# Patient Record
Sex: Female | Born: 1995 | Race: Black or African American | Hispanic: No | Marital: Single | Smoking: Never smoker
Health system: Southern US, Community
[De-identification: ages and names within clinical notes are randomized; demographics above are authoritative.]

## PROBLEM LIST (undated history)

## (undated) DIAGNOSIS — E119 Type 2 diabetes mellitus without complications: Secondary | ICD-10-CM

## (undated) DIAGNOSIS — N949 Unspecified condition associated with female genital organs and menstrual cycle: Principal | ICD-10-CM

## (undated) DIAGNOSIS — Z7689 Persons encountering health services in other specified circumstances: Secondary | ICD-10-CM

## (undated) DIAGNOSIS — N946 Dysmenorrhea, unspecified: Secondary | ICD-10-CM

## (undated) DIAGNOSIS — N920 Excessive and frequent menstruation with regular cycle: Principal | ICD-10-CM

## (undated) DIAGNOSIS — F32A Depression, unspecified: Secondary | ICD-10-CM

## (undated) DIAGNOSIS — F411 Generalized anxiety disorder: Secondary | ICD-10-CM

## (undated) DIAGNOSIS — E669 Obesity, unspecified: Secondary | ICD-10-CM

## (undated) DIAGNOSIS — E282 Polycystic ovarian syndrome: Secondary | ICD-10-CM

## (undated) HISTORY — DX: Obesity, unspecified: E66.9

## (undated) HISTORY — DX: Polycystic ovarian syndrome: E28.2

## (undated) HISTORY — DX: Depression, unspecified: F32.A

## (undated) HISTORY — DX: Type 2 diabetes mellitus without complications: E11.9

## (undated) HISTORY — DX: Generalized anxiety disorder: F41.1

## (undated) HISTORY — DX: Dysmenorrhea, unspecified: N94.6

## (undated) HISTORY — DX: Unspecified condition associated with female genital organs and menstrual cycle: N94.9

## (undated) HISTORY — DX: Excessive and frequent menstruation with regular cycle: N92.0

## (undated) HISTORY — DX: Persons encountering health services in other specified circumstances: Z76.89

## (undated) HISTORY — PX: EXTERNAL EAR SURGERY: SHX627

---
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
## 2016-02-03 DIAGNOSIS — F339 Major depressive disorder, recurrent, unspecified: Secondary | ICD-10-CM | POA: Insufficient documentation

## 2016-02-03 DIAGNOSIS — F411 Generalized anxiety disorder: Secondary | ICD-10-CM | POA: Insufficient documentation

## 2019-06-16 DIAGNOSIS — O99345 Other mental disorders complicating the puerperium: Secondary | ICD-10-CM | POA: Insufficient documentation

## 2019-06-16 DIAGNOSIS — F53 Postpartum depression: Secondary | ICD-10-CM | POA: Insufficient documentation

## 2020-02-07 NOTE — L&D Delivery Note (Addendum)
OB/[facility]N Faculty Practice Delivery Note  Susan Kaiser is a 25 y.o. G2P1001 s/p NSVD at [redacted]w[redacted]d. She was admitted for IOL for chronic HTN and T2DM.   ROM: 4h 34m with clear fluid GBS Status: Negative Maximum Maternal Temperature: 99.5 degrees Fahreinheit  Labor Progress: Patient presented at IOL for cHTN and T2DM. She received cytotec x1, then pitocin and AROM performed, IUPC placed for contraction monitoring, and she progressed to complete/+2.  Delivery Date/Time: 01/24/21 at 1151 Delivery: Called to room and patient was complete and head delivering spontaneously. Head delivered LOA. Loose nuchal cord present, delivered through. Shoulder and body delivered in usual fashion. Infant with spontaneous cry, placed on mother's abdomen, dried and stimulated. Cord clamped x 2 after 1-minute delay, and cut by father of baby under my direct supervision. Due to sluggish breathing infant taken to warmer for stimulation and O2 monitoring. Cord blood drawn. Cord gases attempted but unable to collect. Placenta delivered spontaneously with gentle cord traction. Fundus firm with massage and Pitocin, but then had gush of bleeding with boggy tone appreciated and vaginal sweep with multiple clots, thus 800 mcg Cytotec given per rectum and with fundal massage tone improved and excellent hemostasis achieved. Labia, perineum, vagina, and cervix were inspected, no tears appreciated.   Placenta: complete, three vessel cord appreciated Complications: none Lacerations: None EBL: 250 mL Analgesia: epidural  Postpartum Planning [x]  message to sent to schedule follow-up    Infant: viable female infant   APGARs 7, 9 at 1 and 5 minutes respectively   weight pending  , MD [facility], [facility]       Attestation of Attending Supervision of Obstetric Fellow: Evaluation and management procedures were performed by the Obstetric Fellow under my supervision and collaboration.   I have reviewed the Obstetric Fellow's note and chart, and I agree with the management and plan. I have also made any necessary editorial changes.   [facility], MD Family Medicine Attending, [facility], [facility] 01/24/2021 3:16 PM

## 2020-06-30 ENCOUNTER — Encounter: Payer: Self-pay | Admitting: General Practice

## 2020-07-09 ENCOUNTER — Other Ambulatory Visit: Payer: Self-pay

## 2020-07-09 ENCOUNTER — Other Ambulatory Visit (HOSPITAL_COMMUNITY)
Admission: RE | Admit: 2020-07-09 | Discharge: 2020-07-09 | Disposition: A | Discharge status: Home / Self Care | Payer: Managed Care, Other (non HMO) | Source: Ambulatory Visit | Attending: Family Medicine | Admitting: Family Medicine

## 2020-07-09 ENCOUNTER — Other Ambulatory Visit (HOSPITAL_BASED_OUTPATIENT_CLINIC_OR_DEPARTMENT_OTHER): Payer: Self-pay

## 2020-07-09 ENCOUNTER — Ambulatory Visit (INDEPENDENT_AMBULATORY_CARE_PROVIDER_SITE_OTHER): Payer: Managed Care, Other (non HMO)

## 2020-07-09 VITALS — BP 128/72 | HR 86 | Wt >= 6400 oz

## 2020-07-09 DIAGNOSIS — O219 Vomiting of pregnancy, unspecified: Secondary | ICD-10-CM

## 2020-07-09 DIAGNOSIS — O0990 Supervision of high risk pregnancy, unspecified, unspecified trimester: Secondary | ICD-10-CM | POA: Insufficient documentation

## 2020-07-09 DIAGNOSIS — O10919 Unspecified pre-existing hypertension complicating pregnancy, unspecified trimester: Secondary | ICD-10-CM

## 2020-07-09 DIAGNOSIS — Z6841 Body Mass Index (BMI) 40.0 and over, adult: Secondary | ICD-10-CM

## 2020-07-09 DIAGNOSIS — F419 Anxiety disorder, unspecified: Secondary | ICD-10-CM

## 2020-07-09 DIAGNOSIS — F32A Depression, unspecified: Secondary | ICD-10-CM

## 2020-07-09 DIAGNOSIS — O24111 Pre-existing diabetes mellitus, type 2, in pregnancy, first trimester: Secondary | ICD-10-CM

## 2020-07-09 MED ORDER — SERTRALINE HCL 50 MG PO TABS
50.0000 mg | ORAL_TABLET | Freq: Every day | ORAL | 1 refills | Status: DC
  Filled 2020-07-09: qty 60, 60d supply, fill #0

## 2020-07-09 MED ORDER — DOXYLAMINE-PYRIDOXINE 10-10 MG PO TBEC
DELAYED_RELEASE_TABLET | ORAL | 0 refills | Status: DC
  Filled 2020-07-09: qty 120, 30d supply, fill #0

## 2020-07-09 NOTE — Patient Instructions (Signed)
Obstetrics: Normal and Problem Pregnancies (7th ed., pp. 102-121). [place], [place]: Elsevier."> Textbook of Family Medicine (9th ed., pp. 365-410). [place], [place]: Elsevier [employer].">  First Trimester of Pregnancy  The first trimester of pregnancy starts on the first day of your last menstrual period until the end of week 12. This is months 1 through 3 of pregnancy. A week after a sperm fertilizes an egg, the egg will implant into the wall of the uterus and begin to develop into a baby. By the end of 12 weeks, all the baby's organs will be formed and the baby will be 2-3 inches in size. Body changes during your first trimester Your body goes through many changes during pregnancy. The changes vary and generally return to normal after your baby is born. Physical changes  You may gain or lose weight.  Your breasts may begin to grow larger and become tender. The tissue that surrounds your nipples (areola) may become darker.  Dark spots or blotches (chloasma or mask of pregnancy) may develop on your face.  You may have changes in your hair. These can include thickening or thinning of your hair or changes in texture. Health changes  You may feel nauseous, and you may vomit.  You may have heartburn.  You may develop headaches.  You may develop constipation.  Your gums may bleed and may be sensitive to brushing and flossing. Other changes  You may tire easily.  You may urinate more often.  Your menstrual periods will stop.  You may have a loss of appetite.  You may develop cravings for certain kinds of food.  You may have changes in your emotions from day to day.  You may have more vivid and strange dreams. Follow these instructions at home: Medicines  Follow your health care provider's instructions regarding medicine use. Specific medicines may be either safe or unsafe to take during pregnancy. Do not take any medicines unless told to by your health care provider.  Take a  prenatal vitamin that contains at least 600 micrograms (mcg) of folic acid. Eating and drinking  Eat a healthy diet that includes fresh fruits and vegetables, whole grains, good sources of protein such as meat, eggs, or tofu, and low-fat dairy products.  Avoid raw meat and unpasteurized juice, milk, and cheese. These carry germs that can harm you and your baby.  If you feel nauseous or you vomit: ? Eat 4 or 5 small meals a day instead of 3 large meals. ? Try eating a few soda crackers. ? Drink liquids between meals instead of during meals.  You may need to take these actions to prevent or treat constipation: ? Drink enough fluid to keep your urine pale yellow. ? Eat foods that are high in fiber, such as beans, whole grains, and fresh fruits and vegetables. ? Limit foods that are high in fat and processed sugars, such as fried or sweet foods. Activity  Exercise only as directed by your health care provider. Most people can continue their usual exercise routine during pregnancy. Try to exercise for 30 minutes at least 5 days a week.  Stop exercising if you develop pain or cramping in the lower abdomen or lower back.  Avoid exercising if it is very hot or humid or if you are at high altitude.  Avoid heavy lifting.  If you choose to, you may have sex unless your health care provider tells you not to. Relieving pain and discomfort  Wear a good support bra to relieve breast   tenderness.  Rest with your legs elevated if you have leg cramps or low back pain.  If you develop bulging veins (varicose veins) in your legs: ? Wear support hose as told by your health care provider. ? Elevate your feet for 15 minutes, 3-4 times a day. ? Limit salt in your diet. Safety  Wear your seat belt at all times when driving or riding in a car.  Talk with your health care provider if someone is verbally or physically abusive to you.  Talk with your health care provider if you are feeling sad or have  thoughts of hurting yourself. Lifestyle  Do not use hot tubs, steam rooms, or saunas.  Do not douche. Do not use tampons or scented sanitary pads.  Do not use herbal remedies, alcohol, illegal drugs, or medicines that are not approved by your health care provider. Chemicals in these products can harm your baby.  Do not use any products that contain nicotine or tobacco, such as cigarettes, e-cigarettes, and chewing tobacco. If you need help quitting, ask your health care provider.  Avoid cat litter boxes and soil used by cats. These carry germs that can cause birth defects in the baby and possibly loss of the unborn baby (fetus) by miscarriage or stillbirth. General instructions  During routine prenatal visits in the first trimester, your health care provider will do a physical exam, perform necessary tests, and ask you how things are going. Keep all follow-up visits. This is important.  Ask for help if you have counseling or nutritional needs during pregnancy. Your health care provider can offer advice or refer you to specialists for help with various needs.  Schedule a dentist appointment. At home, brush your teeth with a soft toothbrush. Floss gently.  Write down your questions. Take them to your prenatal visits. Where to find more information  American Pregnancy Association: americanpregnancy.org  American College of Obstetricians and Gynecologists: acog.org/en/Womens%20Health/Pregnancy  Office on Women's Health: womenshealth.gov/pregnancy Contact a health care provider if you have:  Dizziness.  A fever.  Mild pelvic cramps, pelvic pressure, or nagging pain in the abdominal area.  Nausea, vomiting, or diarrhea that lasts for 24 hours or longer.  A bad-smelling vaginal discharge.  Pain when you urinate.  Known exposure to a contagious illness, such as chickenpox, measles, Zika virus, HIV, or hepatitis. Get help right away if you have:  Spotting or bleeding from your  vagina.  Severe abdominal cramping or pain.  Shortness of breath or chest pain.  Any kind of trauma, such as from a fall or a car crash.  New or increased pain, swelling, or redness in an arm or leg. Summary  The first trimester of pregnancy starts on the first day of your last menstrual period until the end of week 12 (months 1 through 3).  Eating 4 or 5 small meals a day rather than 3 large meals may help to relieve nausea and vomiting.  Do not use any products that contain nicotine or tobacco, such as cigarettes, e-cigarettes, and chewing tobacco. If you need help quitting, ask your health care provider.  Keep all follow-up visits. This is important. This information is not intended to replace advice given to you by your health care provider. Make sure you discuss any questions you have with your health care provider. Document Revised: 07/02/2019 Document Reviewed: 05/08/2019 Elsevier Patient Education  2021 Elsevier Inc.  

## 2020-07-09 NOTE — Progress Notes (Signed)
Subjective:   Susan Kaiser is a 25 y.o. G1P0 at [redacted]w[redacted]d by LMP being seen today for her first obstetrical visit.  This is an unplanned, but welcomed pregnancy. Her obstetrical history is significant for cHTN, T2DM, PPD and has Menorrhagia; Obesity; Menstrual extraction; PCO (polycystic ovaries); Endometrial polyp; Unspecified symptom associated with female genital organs; Contraceptive management; and Dysmenorrhea on their problem list.. Patient does intend to breast feed. Pregnancy history fully reviewed.  Patient reports nausea, vomiting and decreased appetite. Has not tried anything to help with nausea. Usually sleeps to help cope. She also reports external vaginal itching x1-2 days. No discharge, bleeding, or abdominal pain. Of note, her PHQ on today's visit was 20. 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 She feels as if her mood has been up and down, but she "just deals with it". She denies SI/HI. She expresses interest in restarting Zoloft today.  HISTORY: OB History  Gravida Para Term Preterm AB Living  2 1 1  0 0 1  SAB IAB Ectopic Multiple Live Births  0 0 0 0 0    # Outcome Date GA Lbr Len/2nd Weight Sex Delivery Anes PTL Lv  2 Current           1 Term            Past Medical History:  Diagnosis Date  . Depression   . Diabetes mellitus without complication (HCC)   . Dysmenorrhea 09/17/2014  . Generalized anxiety disorder   . Menorrhagia 04/21/2013  . Menstrual extraction 05/26/2013  . Obesity   . PCO (polycystic ovaries) 06/10/2013  . Unspecified symptom associated with female genital organs 06/10/2013   Past Surgical History:  Procedure Laterality Date  . EXTERNAL EAR SURGERY     stuck a corn kernnel in her ear when she was 4    Family History  Problem Relation Age of Onset  . Diabetes Paternal Aunt   . Diabetes Paternal Uncle   . Diabetes Maternal Grandmother   . Diabetes Maternal  Grandfather   . Other Mother        cysts on ovaries  . Heart disease Father    Social History   Tobacco Use  . Smoking status: Never Smoker  . Smokeless tobacco: Never Used  Vaping Use  . Vaping Use: Never used  Substance Use Topics  . Alcohol use: No  . Drug use: No   Allergies  Allergen Reactions  . Amoxicillin Anaphylaxis, Itching and Rash   Current Outpatient Medications on File Prior to Visit  Medication Sig Dispense Refill  . prenatal vitamin w/FE, FA (NATACHEW) 29-1 MG CHEW chewable tablet Chew 1 tablet by mouth daily at 12 noon.     No current facility-administered medications on file prior to visit.   Indications for ASA therapy (per uptodate) One of the following: Previous pregnancy with preeclampsia, especially early onset and with an adverse outcome No Multifetal gestation No Chronic hypertension Yes Type 1 or 2 diabetes mellitus Yes Chronic kidney disease No Autoimmune disease (antiphospholipid syndrome, systemic lupus erythematosus) No  Two or more of the following: Nulliparity No Obesity (body mass index >30 kg/m2) Yes Family history of preeclampsia in mother or sister No Age ?35 years No Sociodemographic characteristics (African American race, low socioeconomic level) Yes Personal risk factors (eg, previous pregnancy with low birth weight or small for gestational age infant, previous adverse pregnancy outcome [eg, stillbirth],  interval >10 years between pregnancies) No  Indications for early 1 hour GTT (per uptodate)  BMI >25 (>23 in Asian women) AND one of the following  Gestational diabetes mellitus in a previous pregnancy No Glycated hemoglobin ?5.7 percent (39 mmol/mol), impaired glucose tolerance, or impaired fasting glucose on previous testing No First-degree relative with diabetes No High-risk race/ethnicity (eg, African American, Latino, Native American, [place] American, Pacific Islander) Yes History of cardiovascular disease No Hypertension  or on therapy for hypertension Yes High-density lipoprotein cholesterol level <35 mg/dL (1.61 mmol/L) and/or a triglyceride level >250 mg/dL (0.96 mmol/L) No Polycystic ovary syndrome No Physical inactivity Yes Other clinical condition associated with insulin resistance (eg, severe obesity, acanthosis nigricans) Yes Previous birth of an infant weighing ?4000 g No Previous stillbirth of unknown cause No Exam   Vitals:   07/09/20 0902  BP: 128/72  Pulse: 86  Weight: (!) 424 lb (192.3 kg)      Uterus:     Pelvic Exam: Perineum: no hemorrhoids, normal perineum   Vulva: normal external genitalia, no lesions   Vagina:  normal mucosa, normal discharge   Cervix: Smooth, pink, parous, no lesions, pap smear done.    Adnexa: Technically difficult d/t maternal body habitus   Bony Pelvis: average  System: General: well-developed, well-nourished female in no acute distress   Breast:  normal appearance, no masses or tenderness   Skin: normal coloration and turgor, no rashes   Neurologic: oriented, normal, negative, normal mood   Extremities: normal strength, tone, and muscle mass, ROM of all joints is normal   HEENT PERRLA, extraocular movement intact and sclera clear, anicteric   Mouth/Teeth mucous membranes moist, pharynx normal without lesions and dental hygiene good   Neck supple and no masses   Cardiovascular: regular rate and rhythm   Respiratory:  no respiratory distress, normal breath sounds   Abdomen: soft, non-tender; bowel sounds normal; no masses,  no organomegaly     Assessment:   Pregnancy: G1P0 Patient Active Problem List   Diagnosis Date Noted  . Contraceptive management 09/17/2014  . Dysmenorrhea 09/17/2014  . PCO (polycystic ovaries) 06/10/2013  . Endometrial polyp 06/10/2013  . Unspecified symptom associated with female genital organs 06/10/2013  . Menstrual extraction 05/26/2013  . Menorrhagia 04/21/2013  . Obesity 04/21/2013     Plan:   1. Supervision of  high risk pregnancy, antepartum  - Cytology - PAP( [place]) - Culture, OB Urine - CBC/D/Plt+RPR+Rh+ABO+RubIgG... - Hemoglobpathy+Fer w/A Thal Rfx - Hemoglobin A1c - Cervicovaginal ancillary only( [place]) - US OB Comp Less 14 Wks; Future - Ambulatory referral to Integrated Behavioral Health  2. Anxiety and depression - Given patient's mood and PHQ score today, will start patient on Zoloft 50mg . - Referral to Integrated behavioral health also placed - Warning signs reviewed with patient   - Ambulatory referral to Integrated Behavioral Health - sertraline (ZOLOFT) 50 MG tablet; Take 1 tablet (50 mg total) by mouth daily.  Dispense: 60 tablet; Refill: 1  3. Chronic hypertension affecting pregnancy - Normotensive today - Not on meds - Plan bASA starting at 12-16 weeks  - CMP - Protein creatinine ratio, urine  4. Type 2 diabetes mellitus affecting pregnancy in first trimester, antepartum - Diagnosed prior to previous pregnancy - Was previously on Metformin, however not currently taking medications   - Referral to Nutrition and Diabetes Services  5. Morbid obesity with BMI of 60.0-69.9, adult (HCC) - Plan growth q4 weeks starting at 24 weeks - Weekly BPP starting at  36 weeks  6. Nausea and vomiting during pregnancy  - Doxylamine-Pyridoxine 10-10 MG TBEC; Take 2 tabs at bedtime. If needed, add another tab in the morning. If needed, add another tab in the afternoon, up to 4 tabs/day.  Dispense: 120 tablet; Refill: 0   Initial labs drawn. Continue prenatal vitamins. Discussed and offered genetic screening options, including Quad screen/AFP, NIPS testing, and option to decline testing. Benefits/risks/alternatives reviewed. Pt aware that anatomy US is form of genetic screening with lower accuracy in detecting trisomies than blood work.  Pt declines genetic screening today. NIPS: undecided. Ultrasound discussed; fetal anatomic survey: requested. Problem list reviewed and  updated. The nature of [facility] with multiple MDs and other Advanced Practice Providers was explained to patient; also emphasized that residents, students are part of our team. Routine obstetric precautions reviewed. Return in about 4 weeks (around 08/06/2020) for ROB.    Brand Males, CNM 07/09/20  11:54 AM

## 2020-07-09 NOTE — Progress Notes (Signed)
Pt c/o yeast infection PHQ 20 GAD 18 Pt is unsure when last pap was

## 2020-07-10 LAB — COMPREHENSIVE METABOLIC PANEL
ALT: 26 IU/L (ref 0–32)
AST: 11 IU/L (ref 0–40)
Albumin/Globulin Ratio: 1.5 (ref 1.2–2.2)
Albumin: 4.1 g/dL (ref 3.9–5.0)
Alkaline Phosphatase: 50 IU/L (ref 44–121)
BUN/Creatinine Ratio: 7 — ABNORMAL LOW (ref 9–23)
BUN: 5 mg/dL — ABNORMAL LOW (ref 6–20)
Bilirubin Total: 0.2 mg/dL (ref 0.0–1.2)
CO2: 18 mmol/L — ABNORMAL LOW (ref 20–29)
Calcium: 8.9 mg/dL (ref 8.7–10.2)
Chloride: 106 mmol/L (ref 96–106)
Creatinine, Ser: 0.75 mg/dL (ref 0.57–1.00)
Globulin, Total: 2.8 g/dL (ref 1.5–4.5)
Glucose: 111 mg/dL — ABNORMAL HIGH (ref 65–99)
Potassium: 4 mmol/L (ref 3.5–5.2)
Sodium: 138 mmol/L (ref 134–144)
Total Protein: 6.9 g/dL (ref 6.0–8.5)
eGFR: 114 mL/min/{1.73_m2} (ref >59)

## 2020-07-12 ENCOUNTER — Other Ambulatory Visit (HOSPITAL_BASED_OUTPATIENT_CLINIC_OR_DEPARTMENT_OTHER): Payer: Self-pay

## 2020-07-12 LAB — CERVICOVAGINAL ANCILLARY ONLY
Bacterial Vaginitis (gardnerella): POSITIVE — AB
Candida Glabrata: NEGATIVE
Candida Vaginitis: POSITIVE — AB
Comment: NEGATIVE
Comment: NEGATIVE
Comment: NEGATIVE

## 2020-07-12 LAB — CBC/D/PLT+RPR+RH+ABO+RUBIGG...
Antibody Screen: NEGATIVE
Basophils Absolute: 0 10*3/uL (ref 0.0–0.2)
Basos: 0 %
EOS (ABSOLUTE): 0.3 10*3/uL (ref 0.0–0.4)
Eos: 5 %
HCV Ab: <0.1 s/co ratio (ref 0.0–0.9)
HIV Screen 4th Generation wRfx: NONREACTIVE
Hematocrit: 38.9 % (ref 34.0–46.6)
Hemoglobin: 12.7 g/dL (ref 11.1–15.9)
Hepatitis B Surface Ag: NEGATIVE
Immature Grans (Abs): 0 10*3/uL (ref 0.0–0.1)
Immature Granulocytes: 0 %
Lymphocytes Absolute: 1.6 10*3/uL (ref 0.7–3.1)
Lymphs: 24 %
MCH: 26.6 pg (ref 26.6–33.0)
MCHC: 32.6 g/dL (ref 31.5–35.7)
MCV: 81 fL (ref 79–97)
Monocytes Absolute: 0.3 10*3/uL (ref 0.1–0.9)
Monocytes: 4 %
Neutrophils Absolute: 4.6 10*3/uL (ref 1.4–7.0)
Neutrophils: 67 %
Platelets: 200 10*3/uL (ref 150–450)
RBC: 4.78 x10E6/uL (ref 3.77–5.28)
RDW: 15.8 % — ABNORMAL HIGH (ref 11.7–15.4)
RPR Ser Ql: NONREACTIVE
Rh Factor: POSITIVE
Rubella Antibodies, IGG: 11.1 index (ref >0.99)
WBC: 6.9 10*3/uL (ref 3.4–10.8)

## 2020-07-12 LAB — HGB SOLUBILITY: Hgb Solubility: POSITIVE — AB

## 2020-07-12 LAB — HEMOGLOBPATHY+FER W/A THAL RFX
Ferritin: 28 ng/mL (ref 15–150)
Hgb A2: 3.1 % (ref 1.8–3.2)
Hgb A: 58.5 % — ABNORMAL LOW (ref 96.4–98.8)
Hgb F: 0 % (ref 0.0–2.0)
Hgb S: 38.4 % — ABNORMAL HIGH

## 2020-07-12 LAB — CYTOLOGY - PAP
Chlamydia: NEGATIVE
Comment: NEGATIVE
Comment: NORMAL
Diagnosis: NEGATIVE
Neisseria Gonorrhea: NEGATIVE

## 2020-07-12 LAB — HEMOGLOBIN A1C
Est. average glucose Bld gHb Est-mCnc: 123 mg/dL
Hgb A1c MFr Bld: 5.9 % — ABNORMAL HIGH (ref 4.8–5.6)

## 2020-07-12 LAB — HCV INTERPRETATION

## 2020-07-14 LAB — CULTURE, OB URINE

## 2020-07-14 LAB — URINE CULTURE, OB REFLEX

## 2020-07-16 ENCOUNTER — Other Ambulatory Visit: Payer: Self-pay

## 2020-07-16 DIAGNOSIS — O98819 Other maternal infectious and parasitic diseases complicating pregnancy, unspecified trimester: Secondary | ICD-10-CM

## 2020-07-16 DIAGNOSIS — B9689 Other specified bacterial agents as the cause of diseases classified elsewhere: Secondary | ICD-10-CM

## 2020-07-16 MED ORDER — METRONIDAZOLE 500 MG PO TABS
500.0000 mg | ORAL_TABLET | Freq: Two times a day (BID) | ORAL | 0 refills | Status: DC
Start: 2020-07-16 — End: 2020-10-21

## 2020-07-16 MED ORDER — TERCONAZOLE 0.4 % VA CREA: 1.0000 | TOPICAL_CREAM | Freq: Every day | VAGINAL | 0 refills | Status: DC

## 2020-07-19 ENCOUNTER — Ambulatory Visit (HOSPITAL_BASED_OUTPATIENT_CLINIC_OR_DEPARTMENT_OTHER)
Admission: RE | Admit: 2020-07-19 | Discharge: 2020-07-19 | Disposition: A | Discharge status: Home / Self Care | Payer: Managed Care, Other (non HMO) | Source: Ambulatory Visit

## 2020-07-19 ENCOUNTER — Other Ambulatory Visit: Payer: Self-pay

## 2020-07-19 DIAGNOSIS — O0990 Supervision of high risk pregnancy, unspecified, unspecified trimester: Secondary | ICD-10-CM | POA: Insufficient documentation

## 2020-07-20 ENCOUNTER — Other Ambulatory Visit: Payer: Managed Care, Other (non HMO)

## 2020-07-20 ENCOUNTER — Telehealth: Payer: Self-pay | Admitting: Registered"

## 2020-07-20 NOTE — Telephone Encounter (Signed)
Patient was scheduled for pre-existing diabetes in pregnancy education. Patient did not show for appointment and when RD called patient to reschedule, did not reach patient and was not able to LVM.

## 2020-07-22 ENCOUNTER — Encounter: Payer: Managed Care, Other (non HMO) | Admitting: Licensed Clinical Social Worker

## 2020-07-22 ENCOUNTER — Telehealth: Payer: Self-pay | Admitting: Licensed Clinical Social Worker

## 2020-07-22 NOTE — Telephone Encounter (Signed)
Called pt regarding scheduled mychart visit  

## 2020-08-04 ENCOUNTER — Ambulatory Visit (INDEPENDENT_AMBULATORY_CARE_PROVIDER_SITE_OTHER): Payer: Managed Care, Other (non HMO) | Admitting: Obstetrics & Gynecology

## 2020-08-04 ENCOUNTER — Encounter: Payer: Self-pay | Admitting: Obstetrics & Gynecology

## 2020-08-04 ENCOUNTER — Other Ambulatory Visit: Payer: Self-pay

## 2020-08-04 VITALS — BP 143/64 | HR 93 | Ht 70.0 in | Wt >= 6400 oz

## 2020-08-04 DIAGNOSIS — O0990 Supervision of high risk pregnancy, unspecified, unspecified trimester: Secondary | ICD-10-CM | POA: Insufficient documentation

## 2020-08-04 DIAGNOSIS — O09899 Supervision of other high risk pregnancies, unspecified trimester: Secondary | ICD-10-CM | POA: Insufficient documentation

## 2020-08-04 DIAGNOSIS — Z6841 Body Mass Index (BMI) 40.0 and over, adult: Secondary | ICD-10-CM | POA: Insufficient documentation

## 2020-08-04 DIAGNOSIS — Z3480 Encounter for supervision of other normal pregnancy, unspecified trimester: Secondary | ICD-10-CM

## 2020-08-04 DIAGNOSIS — O24319 Unspecified pre-existing diabetes mellitus in pregnancy, unspecified trimester: Secondary | ICD-10-CM | POA: Insufficient documentation

## 2020-08-04 DIAGNOSIS — O99210 Obesity complicating pregnancy, unspecified trimester: Secondary | ICD-10-CM

## 2020-08-04 DIAGNOSIS — O10019 Pre-existing essential hypertension complicating pregnancy, unspecified trimester: Secondary | ICD-10-CM | POA: Insufficient documentation

## 2020-08-04 DIAGNOSIS — Z3A14 14 weeks gestation of pregnancy: Secondary | ICD-10-CM

## 2020-08-04 NOTE — Progress Notes (Signed)
   PRENATAL VISIT NOTE  Subjective:  Susan Kaiser is a 25 y.o. G2P1001 at [redacted]w[redacted]d being seen today for ongoing prenatal care.  She is currently monitored for the following issues for this high-risk pregnancy and has Obesity affecting pregnancy, antepartum; PCO (polycystic ovaries); Supervision of other normal pregnancy, antepartum; Chronic benign essential hypertension, antepartum; Pre-existing diabetes mellitus affecting pregnancy, antepartum; BMI 60.0-69.9, adult (HCC); and Maternal exposure to alcohol, antepartum on their problem list.  Patient reports nausea.  Contractions: Not present. Vag. Bleeding: None.  Movement: Absent. Denies leaking of fluid.   The following portions of the patient's history were reviewed and updated as appropriate: allergies, current medications, past family history, past medical history, past social history, past surgical history and problem list.   Objective:   Vitals:   08/04/20 1629  BP: (!) 143/64  Pulse: 93  Weight: (!) 424 lb (192.3 kg)  Height: 5\' 10"  (1.778 m)    Fetal Status:     Movement: Absent     General:  Alert, oriented and cooperative. Patient is in no acute distress.  Skin: Skin is warm and dry. No rash noted.   Cardiovascular: Normal heart rate noted  Respiratory: Normal respiratory effort, no problems with respiration noted  Abdomen: Soft, gravid, appropriate for gestational age.  Pain/Pressure: Absent     Pelvic: Cervical exam deferred        Extremities: Normal range of motion.  Edema: None  Mental Status: Normal mood and affect. Normal behavior. Normal judgment and thought content.   Assessment and Plan:  Pregnancy: G2P1001 at [redacted]w[redacted]d 1. Supervision of other normal pregnancy, antepartum FHR viewed using abd [redacted]w[redacted]d Will come back in am for NIPS  2. Chronic benign essential hypertension, antepartum Begin baby ASA  Pt was not on meds prior to pregnancy    - Referral to Nutrition and Diabetes Services - [place] MFM OB DETAIL +14 WK;  Future  3. Pre-existing diabetes mellitus affecting pregnancy, antepartum Begin baby ASA  Pt was not on meds prior to pregnancy  - Referral to Nutrition and Diabetes Services - [place] MFM OB DETAIL +14 WK; Future  4. [redacted] weeks gestation of pregnancy  5. Obesity affecting pregnancy, antepartum Nutrition counseling.  Pt is heavier than with first baby.  Gained weight PP due to depression   6. Early ETOH exposure.    Preterm labor symptoms and general obstetric precautions including but not limited to vaginal bleeding, contractions, leaking of fluid and fetal movement were reviewed in detail with the patient. Please refer to After Visit Summary for other counseling recommendations.   Return in about 4 weeks (around 09/01/2020) for in person.  Future Appointments  Date Time Provider Department Center  09/01/2020  3:15 PM 09/03/2020, MD [facility] None  09/30/2020  9:55 AM 10/02/2020, Adrian Blackwater, DO [facility] None    Rhona Raider, MD

## 2020-08-04 NOTE — Progress Notes (Signed)
Pt missed appointment with Nutrition management. Has not rescheduled.

## 2020-08-10 ENCOUNTER — Other Ambulatory Visit: Payer: Self-pay

## 2020-08-10 ENCOUNTER — Ambulatory Visit (INDEPENDENT_AMBULATORY_CARE_PROVIDER_SITE_OTHER): Payer: Managed Care, Other (non HMO)

## 2020-08-10 DIAGNOSIS — Z3A15 15 weeks gestation of pregnancy: Secondary | ICD-10-CM

## 2020-08-10 NOTE — Progress Notes (Signed)
Pt presents for Panorama and AFP labs. Pt was sent to the lab have labs drawn.  Dhilan Brauer l Jessaca Philippi, CMA

## 2020-08-14 LAB — AFP, SERUM, OPEN SPINA BIFIDA
AFP MoM: 0.5
AFP Value: 10.7 ng/mL
Gest. Age on Collection Date: 15.2 weeks
Maternal Age At EDD: 25.2 yr
OSBR Risk 1 IN: 10000
Test Results:: NEGATIVE
Weight: 424 [lb_av]

## 2020-08-19 ENCOUNTER — Other Ambulatory Visit: Payer: Managed Care, Other (non HMO)

## 2020-08-23 ENCOUNTER — Encounter: Payer: Self-pay | Admitting: General Practice

## 2020-09-01 ENCOUNTER — Encounter: Payer: Managed Care, Other (non HMO) | Admitting: Obstetrics & Gynecology

## 2020-09-06 ENCOUNTER — Ambulatory Visit: Payer: Managed Care, Other (non HMO)

## 2020-09-07 ENCOUNTER — Other Ambulatory Visit: Payer: Self-pay

## 2020-09-07 ENCOUNTER — Ambulatory Visit (INDEPENDENT_AMBULATORY_CARE_PROVIDER_SITE_OTHER): Payer: Managed Care, Other (non HMO)

## 2020-09-07 VITALS — BP 117/79 | HR 92 | Wt >= 6400 oz

## 2020-09-07 DIAGNOSIS — O0990 Supervision of high risk pregnancy, unspecified, unspecified trimester: Secondary | ICD-10-CM

## 2020-09-07 DIAGNOSIS — O24319 Unspecified pre-existing diabetes mellitus in pregnancy, unspecified trimester: Secondary | ICD-10-CM

## 2020-09-07 DIAGNOSIS — O10919 Unspecified pre-existing hypertension complicating pregnancy, unspecified trimester: Secondary | ICD-10-CM

## 2020-09-07 DIAGNOSIS — Z6841 Body Mass Index (BMI) 40.0 and over, adult: Secondary | ICD-10-CM

## 2020-09-07 DIAGNOSIS — Z3A19 19 weeks gestation of pregnancy: Secondary | ICD-10-CM

## 2020-09-07 LAB — GLUCOSE, POCT (MANUAL RESULT ENTRY): POC Glucose: 85 mg/dl (ref 70–99)

## 2020-09-07 MED ORDER — ACCU-CHEK SOFTCLIX LANCETS MISC: 100.0000 | Freq: Four times a day (QID) | 12 refills | Status: DC

## 2020-09-07 MED ORDER — ACCU-CHEK GUIDE W/DEVICE KIT: 1.0000 | PACK | Freq: Once | 0 refills | Status: AC

## 2020-09-07 MED ORDER — ACCU-CHEK GUIDE VI STRP: ORAL_STRIP | 12 refills | Status: DC

## 2020-09-07 NOTE — Progress Notes (Signed)
HIGH-RISK PREGNANCY OFFICE VISIT  Patient name: Susan Kaiser MRN [id number]  Date of birth: 07-Oct-1995 Chief Complaint:   No chief complaint on file.  Subjective:   [name] is a 25 y.o. G22P1001 female at [redacted]w[redacted]d with an Estimated Date of Delivery: 01/30/21 being seen today for ongoing management of a high-risk pregnancy aeb has Obesity affecting pregnancy, antepartum; PCO (polycystic ovaries); Supervision of other normal pregnancy, antepartum; Chronic benign essential hypertension, antepartum; Pre-existing diabetes mellitus affecting pregnancy, antepartum; BMI 60.0-69.9, adult (HCC); and Maternal exposure to alcohol, antepartum on their problem list.  Patient presents today with no complaints. She reports her nausea has improved and she is "taking it easy."  She reports some "growing pains" with walking that occurs in the bilateral lower abdominal region.   Patient endorses fetal flutters. Patient denies abdominal cramping or contractions.  Patient denies vaginal concerns including abnormal discharge, leaking of fluid, and bleeding.  Contractions: Not present. Vag. Bleeding: None.  Movement: Present.  She states she has not received her glucometer for blood sugar testing. She states she has not had anything to eat this morning and had a sugar free hydration packet this morning.    Reviewed past medical,surgical, social, obstetrical and family history as well as problem list, medications and allergies.  Objective   Vitals:   09/07/20 1106  BP: 117/79  Pulse: 92  Weight: (!) 418 lb (189.6 kg)  Body mass index is 59.98 kg/m.  Total Weight Gain:-20 lb (-9.072 kg)         Physical Examination:   General appearance: Well appearing, and in no distress  Mental status: Alert, oriented to person, place, and time  Skin: Warm & dry  Cardiovascular: Normal heart rate noted  Respiratory: Normal respiratory effort, no distress  Abdomen: Soft, gravid, nontender, Not assessed d/t body  habitus  Pelvic: Cervical exam deferred           Extremities: Edema: None  Fetal Status:    Movement: Present   No results found for this or any previous visit (from the past 24 hour(s)).  Assessment & Plan:  High-risk pregnancy of a 25 y.o., G2P1001 at [redacted]w[redacted]d with an Estimated Date of Delivery: 01/30/21   1. Supervision of high risk pregnancy, antepartum -Anticipatory guidance for upcoming appts. -Patient to schedule next appt in 4-5 weeks for an in-person visit. -Review of chart shows patient missed [place] appt yesterday. -Message sent to [facility] for attempt to reschedule.  2. Chronic hypertension affecting pregnancy -BP normotensive today. -Taking baby aspirin  3. Pre-existing diabetes mellitus affecting pregnancy, antepartum -Not taking BS, reports no glucometer. -Random 85 today. -Will send in script for supplies and nutritional/diabetes education consult. -Review of HgbA1C reveals initial of level of 5.9 -Dr. Austin Miles consulted and informed of patient status, evaluation, interventions, and results. Advised: *Patient to receive 2hr GTT *Okay to check BS in interim -Further review of WF labs shows levels between 5.7-6.2 in past.  4. [redacted] weeks gestation of pregnancy -Doing well overall. -Reviewed where to go for pregnancy related concerns.   5. Morbid obesity with BMI of 60.0-69.9, adult (HCC) -TWG (-) 20lbs      Meds: No orders of the defined types were placed in this encounter.  Labs/procedures today:  Lab Orders  No laboratory test(s) ordered today     Reviewed: Preterm labor symptoms and general obstetric precautions including but not limited to vaginal bleeding, contractions, leaking of fluid and fetal movement were reviewed in detail with the patient.  All questions were answered.  Follow-up: No follow-ups on file.  No orders of the defined types were placed in this encounter.  Cherre Robins MSN, CNM 09/07/2020

## 2020-09-20 ENCOUNTER — Encounter: Payer: Self-pay | Admitting: Obstetrics & Gynecology

## 2020-09-20 ENCOUNTER — Telehealth: Payer: Self-pay

## 2020-09-20 DIAGNOSIS — D573 Sickle-cell trait: Secondary | ICD-10-CM | POA: Insufficient documentation

## 2020-09-20 NOTE — Telephone Encounter (Signed)
Left message for patient to return call to office.  Genetics counselor order placed. FOB needs to be tested for sickle cell. Armandina Stammer RN

## 2020-09-20 NOTE — Telephone Encounter (Signed)
-----   Message from Willodean Rosenthal, MD sent at 09/20/2020 12:25 PM EDT ----- This is an addendum to prev note. The FOB needs to be tested for sickle cell. Please call pt.   Thanks,   Clh-S

## 2020-09-30 ENCOUNTER — Encounter: Payer: Managed Care, Other (non HMO) | Admitting: Family Medicine

## 2020-09-30 ENCOUNTER — Ambulatory Visit: Payer: Managed Care, Other (non HMO)

## 2020-10-01 ENCOUNTER — Encounter: Payer: Managed Care, Other (non HMO) | Admitting: Family Medicine

## 2020-10-06 ENCOUNTER — Ambulatory Visit (HOSPITAL_BASED_OUTPATIENT_CLINIC_OR_DEPARTMENT_OTHER): Payer: Managed Care, Other (non HMO)

## 2020-10-06 ENCOUNTER — Ambulatory Visit: Payer: Managed Care, Other (non HMO) | Attending: Obstetrics and Gynecology | Admitting: *Deleted

## 2020-10-06 ENCOUNTER — Ambulatory Visit: Payer: Managed Care, Other (non HMO) | Attending: Obstetrics and Gynecology | Admitting: Obstetrics and Gynecology

## 2020-10-06 ENCOUNTER — Other Ambulatory Visit: Payer: Self-pay

## 2020-10-06 ENCOUNTER — Encounter: Payer: Self-pay | Admitting: *Deleted

## 2020-10-06 ENCOUNTER — Other Ambulatory Visit: Payer: Self-pay | Admitting: *Deleted

## 2020-10-06 VITALS — BP 147/58 | HR 91

## 2020-10-06 DIAGNOSIS — O99212 Obesity complicating pregnancy, second trimester: Secondary | ICD-10-CM

## 2020-10-06 DIAGNOSIS — O9902 Anemia complicating childbirth: Secondary | ICD-10-CM

## 2020-10-06 DIAGNOSIS — O24319 Unspecified pre-existing diabetes mellitus in pregnancy, unspecified trimester: Secondary | ICD-10-CM

## 2020-10-06 DIAGNOSIS — Z363 Encounter for antenatal screening for malformations: Secondary | ICD-10-CM | POA: Diagnosis not present

## 2020-10-06 DIAGNOSIS — O10019 Pre-existing essential hypertension complicating pregnancy, unspecified trimester: Secondary | ICD-10-CM

## 2020-10-06 DIAGNOSIS — O24912 Unspecified diabetes mellitus in pregnancy, second trimester: Secondary | ICD-10-CM

## 2020-10-06 DIAGNOSIS — O99310 Alcohol use complicating pregnancy, unspecified trimester: Secondary | ICD-10-CM | POA: Diagnosis not present

## 2020-10-06 DIAGNOSIS — O09899 Supervision of other high risk pregnancies, unspecified trimester: Secondary | ICD-10-CM

## 2020-10-06 DIAGNOSIS — O10012 Pre-existing essential hypertension complicating pregnancy, second trimester: Secondary | ICD-10-CM

## 2020-10-06 DIAGNOSIS — Z3A23 23 weeks gestation of pregnancy: Secondary | ICD-10-CM | POA: Insufficient documentation

## 2020-10-06 DIAGNOSIS — O99342 Other mental disorders complicating pregnancy, second trimester: Secondary | ICD-10-CM | POA: Diagnosis not present

## 2020-10-06 DIAGNOSIS — O24112 Pre-existing diabetes mellitus, type 2, in pregnancy, second trimester: Secondary | ICD-10-CM | POA: Insufficient documentation

## 2020-10-06 DIAGNOSIS — E669 Obesity, unspecified: Secondary | ICD-10-CM

## 2020-10-06 DIAGNOSIS — Z6841 Body Mass Index (BMI) 40.0 and over, adult: Secondary | ICD-10-CM

## 2020-10-06 DIAGNOSIS — F1099 Alcohol use, unspecified with unspecified alcohol-induced disorder: Secondary | ICD-10-CM | POA: Insufficient documentation

## 2020-10-06 DIAGNOSIS — O99312 Alcohol use complicating pregnancy, second trimester: Secondary | ICD-10-CM | POA: Diagnosis not present

## 2020-10-06 DIAGNOSIS — E119 Type 2 diabetes mellitus without complications: Secondary | ICD-10-CM

## 2020-10-06 DIAGNOSIS — Z3689 Encounter for other specified antenatal screening: Secondary | ICD-10-CM

## 2020-10-06 DIAGNOSIS — O10912 Unspecified pre-existing hypertension complicating pregnancy, second trimester: Secondary | ICD-10-CM

## 2020-10-06 DIAGNOSIS — Z148 Genetic carrier of other disease: Secondary | ICD-10-CM | POA: Insufficient documentation

## 2020-10-06 DIAGNOSIS — Z3480 Encounter for supervision of other normal pregnancy, unspecified trimester: Secondary | ICD-10-CM

## 2020-10-06 DIAGNOSIS — D573 Sickle-cell trait: Secondary | ICD-10-CM

## 2020-10-06 NOTE — Progress Notes (Signed)
Maternal-Fetal Medicine   Name: Susan Kaiser DOB: 1996-01-04 MRN: [id number] Referring Provider: Gerrit Kaiser, CNM  I had the pleasure of seeing Susan Kaiser today at [employer] for [facility].  She is G2 P1 at 23-weeks' gestation and is here for fetal anatomy scan.  Past medical history significant for type 2 diabetes.  She reports her diabetes is well controlled.  It is not clear whether she is checking her blood glucose regularly.  She does not take oral hypoglycemics or insulin. Chronic hypertension.  Well-controlled without antihypertensives.  Blood pressure today at her office is 130/78 mmHg.  Patient takes low-dose aspirin prophylaxis. Class III obesity.  Prepregnancy BMI 60.  Obstetrical history significant for a term vaginal delivery and the infant weighed 6 pounds at birth.  Medications: Prenatal vitamins, low-dose aspirin, sertraline. Allergies: Amoxicillin (rashes). Prenatal course: On cell free fetal DNA screening, the risks of fetal aneuploidies are not increased.  MSAFP screening showed low risk for open neural tube defects.  Most recent hemoglobin A1c was 5.9%.  Ultrasound We performed fetal anatomical survey.  Amniotic fluid is normal good fetal activity seen.  Fetal biometry is consistent with her previously established dates.  No markers of aneuploidies or fetal structural defects are seen.  Fetal facial anatomy and profile could not be evaluated because of fetal position. Patient understands the limitations of ultrasound in detecting fetal anomalies. As maternal obesity imposes limitations on resolution of images fetal anomalies may be missed.  Our concerns include Type 2 diabetes -I discussed the importance of good blood glucose control to prevent adverse fetal or neonatal outcomes.  Complications of diabetes include fetal congenital anomalies, fetal macrosomia, stillbirth and poorly controlled diabetes and neonatal complications including respiratory distress  syndrome. -Recent hemoglobin A1c measurement is reassuring and the likelihood of congenital anomalies are likely to be low. -I explained our ultrasound protocol of monitoring fetal growth restriction and weekly antenatal testing from [redacted] weeks gestation. -I discussed timing of delivery.  If diabetes is not well controlled, early term delivery may be recommended.  Since the patient has both chronic hypertension and diabetes, we may recommend delivery at [redacted] weeks gestation.  Chronic hypertension -I discussed the importance of good blood pressure control to prevent adverse maternal complications that include stroke, renal damage, coagulation disturbances and endorgan damage.  Placental abruption is more common with chronic hypertension. -Superimposed preeclampsia present about 30% of cases.  I discussed the benefit of low-dose aspirin prophylaxis in delaying or preventing preeclampsia. -Patient will benefit from home blood pressure monitoring.  Recommendations -An appointment was made for her to return in 4 weeks for fetal growth assessment and completion of fetal anatomy. -We have requested an appointment for fetal echocardiography [facility] [place]). -Fetal growth assessment every 4 weeks. -Weekly BPP from [redacted] weeks gestation till delivery. -Low-dose aspirin to continue. -Timing of delivery is based on blood pressure and control of diabetes. -Ophthalmological examination to rule out retinopathy.   Thank you for consultation.  If you have any questions or concerns, please contact me the [facility].  Consultation including face-to-face (more than 50%) counseling 30 minutes.

## 2020-10-18 ENCOUNTER — Telehealth: Payer: Self-pay

## 2020-10-18 NOTE — Telephone Encounter (Signed)
FETAL ECHO SCHEDULED FOR 11/02/2020@1045A .

## 2020-10-21 ENCOUNTER — Other Ambulatory Visit: Payer: Self-pay

## 2020-10-21 ENCOUNTER — Encounter: Payer: Self-pay | Admitting: General Practice

## 2020-10-21 ENCOUNTER — Ambulatory Visit (INDEPENDENT_AMBULATORY_CARE_PROVIDER_SITE_OTHER): Payer: Managed Care, Other (non HMO) | Admitting: Family Medicine

## 2020-10-21 VITALS — BP 111/62 | HR 94 | Wt >= 6400 oz

## 2020-10-21 DIAGNOSIS — O99210 Obesity complicating pregnancy, unspecified trimester: Secondary | ICD-10-CM

## 2020-10-21 DIAGNOSIS — Z23 Encounter for immunization: Secondary | ICD-10-CM

## 2020-10-21 DIAGNOSIS — D573 Sickle-cell trait: Secondary | ICD-10-CM

## 2020-10-21 DIAGNOSIS — Z3480 Encounter for supervision of other normal pregnancy, unspecified trimester: Secondary | ICD-10-CM

## 2020-10-21 DIAGNOSIS — O24319 Unspecified pre-existing diabetes mellitus in pregnancy, unspecified trimester: Secondary | ICD-10-CM

## 2020-10-21 DIAGNOSIS — O9902 Anemia complicating childbirth: Secondary | ICD-10-CM

## 2020-10-21 DIAGNOSIS — O10019 Pre-existing essential hypertension complicating pregnancy, unspecified trimester: Secondary | ICD-10-CM

## 2020-10-21 DIAGNOSIS — Z3A25 25 weeks gestation of pregnancy: Secondary | ICD-10-CM

## 2020-10-21 NOTE — Patient Instructions (Signed)

## 2020-10-21 NOTE — Progress Notes (Signed)
   PRENATAL VISIT NOTE  Subjective:  Susan Kaiser is a 25 y.o. G2P1001 at [redacted]w[redacted]d being seen today for ongoing prenatal care.  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  Patient reports no complaints.  Contractions: Not present. Vag. Bleeding: None.  Movement: Present. Denies leaking of fluid.   The following portions of the patient's history were reviewed and updated as appropriate: allergies, current medications, past family history, past medical history, past social history, past surgical history and problem list.   Objective:   Vitals:   10/21/20 0820  BP: 111/62  Pulse: 94  Weight: (!) 418 lb (189.6 kg)    Fetal Status: Fetal Heart Rate (bpm): 163   Movement: Present     General:  Alert, oriented and cooperative. Patient is in no acute distress.  Skin: Skin is warm and dry. No rash noted.   Cardiovascular: Normal heart rate noted  Respiratory: Normal respiratory effort, no problems with respiration noted  Abdomen: Soft, gravid, appropriate for gestational age.  Pain/Pressure: Absent     Pelvic: Cervical exam deferred        Extremities: Normal range of motion.  Edema: None  Mental Status: Normal mood and affect. Normal behavior. Normal judgment and thought content.   Assessment and Plan:  Pregnancy: G2P1001 at [redacted]w[redacted]d 1. [redacted] weeks gestation of pregnancy   2. Chronic benign essential  hypertension, antepartum BP is well controlled on no meds Recent growth WNL Serial u/s for growth scheduled Continue ASA  3. Pre-existing diabetes mellitus affecting pregnancy, antepartum Pre-diabetes only--managing as T2DM on no meds No log today per her report: FBS in the 80s 2 hour pp 100-105 Discussed increasing CBGs as pregnancy progresses Bring log every visit  4. Supervision of other normal pregnancy, antepartum 28 wk labs today--no glucose challenge due to presumed T2DM (pre-diabetes) - RPR - HIV antibody (with reflex) - CBC - Tdap vaccine greater than or equal to 7yo IM - Flu Vaccine QUAD 36+ mos IM (Fluarix, Quad PF)  5. Obesity affecting pregnancy, antepartum TWG - 20 lbs  6. 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 Please refer to After Visit Summary for other counseling recommendations.   Return in 2 weeks (on 11/04/2020).  Future Appointments  Date Time Provider Department Center  11/03/2020  2:30 PM [facility] NURSE [facility]  11/03/2020  2:45 PM [facility] US6 [facility]    Reva Bores, MD

## 2020-10-22 LAB — CBC
Hematocrit: 33.7 % — ABNORMAL LOW (ref 34.0–46.6)
Hemoglobin: 11.1 g/dL (ref 11.1–15.9)
MCH: 26 pg — ABNORMAL LOW (ref 26.6–33.0)
MCHC: 32.9 g/dL (ref 31.5–35.7)
MCV: 79 fL (ref 79–97)
Platelets: 177 10*3/uL (ref 150–450)
RBC: 4.27 x10E6/uL (ref 3.77–5.28)
RDW: 15 % (ref 11.7–15.4)
WBC: 10.2 10*3/uL (ref 3.4–10.8)

## 2020-10-22 LAB — RPR: RPR Ser Ql: NONREACTIVE

## 2020-10-22 LAB — HIV ANTIBODY (ROUTINE TESTING W REFLEX): HIV Screen 4th Generation wRfx: NONREACTIVE

## 2020-10-28 ENCOUNTER — Encounter: Payer: Managed Care, Other (non HMO) | Admitting: Family Medicine

## 2020-11-03 ENCOUNTER — Other Ambulatory Visit: Payer: Self-pay

## 2020-11-03 ENCOUNTER — Ambulatory Visit: Payer: Managed Care, Other (non HMO) | Attending: Obstetrics and Gynecology

## 2020-11-03 ENCOUNTER — Ambulatory Visit: Payer: Managed Care, Other (non HMO) | Admitting: *Deleted

## 2020-11-03 ENCOUNTER — Other Ambulatory Visit: Payer: Self-pay | Admitting: Obstetrics and Gynecology

## 2020-11-03 VITALS — BP 140/72 | HR 98

## 2020-11-03 DIAGNOSIS — O24112 Pre-existing diabetes mellitus, type 2, in pregnancy, second trimester: Secondary | ICD-10-CM

## 2020-11-03 DIAGNOSIS — O10012 Pre-existing essential hypertension complicating pregnancy, second trimester: Secondary | ICD-10-CM

## 2020-11-03 DIAGNOSIS — O24319 Unspecified pre-existing diabetes mellitus in pregnancy, unspecified trimester: Secondary | ICD-10-CM | POA: Diagnosis present

## 2020-11-03 DIAGNOSIS — F1099 Alcohol use, unspecified with unspecified alcohol-induced disorder: Secondary | ICD-10-CM

## 2020-11-03 DIAGNOSIS — Z6841 Body Mass Index (BMI) 40.0 and over, adult: Secondary | ICD-10-CM

## 2020-11-03 DIAGNOSIS — O9902 Anemia complicating childbirth: Secondary | ICD-10-CM | POA: Insufficient documentation

## 2020-11-03 DIAGNOSIS — O365920 Maternal care for other known or suspected poor fetal growth, second trimester, not applicable or unspecified: Secondary | ICD-10-CM

## 2020-11-03 DIAGNOSIS — O10912 Unspecified pre-existing hypertension complicating pregnancy, second trimester: Secondary | ICD-10-CM | POA: Insufficient documentation

## 2020-11-03 DIAGNOSIS — D573 Sickle-cell trait: Secondary | ICD-10-CM | POA: Diagnosis present

## 2020-11-03 DIAGNOSIS — Z3A27 27 weeks gestation of pregnancy: Secondary | ICD-10-CM

## 2020-11-03 DIAGNOSIS — Z3480 Encounter for supervision of other normal pregnancy, unspecified trimester: Secondary | ICD-10-CM | POA: Insufficient documentation

## 2020-11-03 DIAGNOSIS — O24912 Unspecified diabetes mellitus in pregnancy, second trimester: Secondary | ICD-10-CM | POA: Diagnosis present

## 2020-11-03 DIAGNOSIS — O99212 Obesity complicating pregnancy, second trimester: Secondary | ICD-10-CM

## 2020-11-03 DIAGNOSIS — O99312 Alcohol use complicating pregnancy, second trimester: Secondary | ICD-10-CM

## 2020-11-03 DIAGNOSIS — E669 Obesity, unspecified: Secondary | ICD-10-CM

## 2020-11-04 ENCOUNTER — Other Ambulatory Visit: Payer: Self-pay | Admitting: *Deleted

## 2020-11-04 DIAGNOSIS — O10912 Unspecified pre-existing hypertension complicating pregnancy, second trimester: Secondary | ICD-10-CM

## 2020-11-04 DIAGNOSIS — O24112 Pre-existing diabetes mellitus, type 2, in pregnancy, second trimester: Secondary | ICD-10-CM

## 2020-11-04 DIAGNOSIS — O365920 Maternal care for other known or suspected poor fetal growth, second trimester, not applicable or unspecified: Secondary | ICD-10-CM

## 2020-11-12 ENCOUNTER — Telehealth (INDEPENDENT_AMBULATORY_CARE_PROVIDER_SITE_OTHER): Payer: Managed Care, Other (non HMO) | Admitting: Family Medicine

## 2020-11-12 VITALS — BP 110/65 | HR 105

## 2020-11-12 DIAGNOSIS — O365930 Maternal care for other known or suspected poor fetal growth, third trimester, not applicable or unspecified: Secondary | ICD-10-CM

## 2020-11-12 DIAGNOSIS — F419 Anxiety disorder, unspecified: Secondary | ICD-10-CM

## 2020-11-12 DIAGNOSIS — O10019 Pre-existing essential hypertension complicating pregnancy, unspecified trimester: Secondary | ICD-10-CM

## 2020-11-12 DIAGNOSIS — F418 Other specified anxiety disorders: Secondary | ICD-10-CM

## 2020-11-12 DIAGNOSIS — Z3A28 28 weeks gestation of pregnancy: Secondary | ICD-10-CM

## 2020-11-12 DIAGNOSIS — O9902 Anemia complicating childbirth: Secondary | ICD-10-CM

## 2020-11-12 DIAGNOSIS — O365990 Maternal care for other known or suspected poor fetal growth, unspecified trimester, not applicable or unspecified: Secondary | ICD-10-CM

## 2020-11-12 DIAGNOSIS — O10013 Pre-existing essential hypertension complicating pregnancy, third trimester: Secondary | ICD-10-CM

## 2020-11-12 DIAGNOSIS — O99343 Other mental disorders complicating pregnancy, third trimester: Secondary | ICD-10-CM

## 2020-11-12 DIAGNOSIS — O24313 Unspecified pre-existing diabetes mellitus in pregnancy, third trimester: Secondary | ICD-10-CM

## 2020-11-12 DIAGNOSIS — Z6841 Body Mass Index (BMI) 40.0 and over, adult: Secondary | ICD-10-CM

## 2020-11-12 DIAGNOSIS — D573 Sickle-cell trait: Secondary | ICD-10-CM

## 2020-11-12 DIAGNOSIS — O99213 Obesity complicating pregnancy, third trimester: Secondary | ICD-10-CM

## 2020-11-12 DIAGNOSIS — F32A Depression, unspecified: Secondary | ICD-10-CM

## 2020-11-12 DIAGNOSIS — O99013 Anemia complicating pregnancy, third trimester: Secondary | ICD-10-CM

## 2020-11-12 DIAGNOSIS — E119 Type 2 diabetes mellitus without complications: Secondary | ICD-10-CM

## 2020-11-12 DIAGNOSIS — Z3480 Encounter for supervision of other normal pregnancy, unspecified trimester: Secondary | ICD-10-CM

## 2020-11-12 DIAGNOSIS — O24319 Unspecified pre-existing diabetes mellitus in pregnancy, unspecified trimester: Secondary | ICD-10-CM

## 2020-11-12 DIAGNOSIS — E669 Obesity, unspecified: Secondary | ICD-10-CM

## 2020-11-12 MED ORDER — SERTRALINE HCL 100 MG PO TABS: 100.0000 mg | ORAL_TABLET | Freq: Every day | ORAL | 3 refills | Status: AC

## 2020-11-12 NOTE — Progress Notes (Signed)
Patient agrees to virtual visit. Shia Eber RN  ?

## 2020-11-12 NOTE — Progress Notes (Signed)
OBSTETRICS PRENATAL VIRTUAL VISIT ENCOUNTER NOTE  Provider location: Center for [facility]   Patient location: Home  I connected with Susan Kaiser on 11/12/20 at 10:55 AM EDT by MyChart Video Encounter and verified that I am speaking with the correct person using two identifiers. I discussed the limitations, risks, security and privacy concerns of performing an evaluation and management service virtually and the availability of in person appointments. I also discussed with the patient that there may be a patient responsible charge related to this service. The patient expressed understanding and agreed to proceed. Subjective:  Susan Kaiser is a 25 y.o. G2P1001 at [redacted]w[redacted]d being seen today for ongoing prenatal care.  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  Patient reports no complaints. Is having increased depression.  Contractions: Not present. Vag. Bleeding: None.  Movement: Present. Denies any leaking of fluid.   The following portions of the patient's history were reviewed and updated as appropriate: allergies, current medications, past family history, past medical history, past social history, past surgical history and problem list.   Objective:   Vitals:   11/12/20 1002  BP: 110/65  Pulse: (!) 105    Fetal Status:     Movement: Present     General:  Alert,  oriented and cooperative. Patient is in no acute distress.  Respiratory: Normal respiratory effort, no problems with respiration noted  Mental Status: Normal mood and affect. Normal behavior. Normal judgment and thought content.  Rest of physical exam deferred due to type of encounter  Imaging: [place] MFM OB FOLLOW UP  Result Date: 11/03/2020 ----------------------------------------------------------------------  OBSTETRICS REPORT                       (Signed Final 11/03/2020 06:08 pm) ---------------------------------------------------------------------- Patient Info  ID #:       [id number]                          D.O.B.:  1995/07/31 (25 yrs)  Name:       Susan Kaiser                 Visit Date: 11/03/2020 02:43 pm ---------------------------------------------------------------------- Performed By  Attending:        Noralee Space MD        Ref. Address:     [address]                                                             [place], [place]  [address]  Performed By:     Redgie Grayer      [facility]  Referred By:      [facility] ---------------------------------------------------------------------- Orders  #  Description                           Code        Ordered By  1  [place] MFM OB FOLLOW UP                   [phone]    [facility]VI [facility]  2  [place] MFM UA CORD DOPPLER                [id number]    RAVI [facility] ----------------------------------------------------------------------  #  Order #                     Accession #                Episode #  1  [id number]                   [id number]                 [id number]  2  [id number]                   [id number]                 [id number]  ---------------------------------------------------------------------- Indications  [redacted] weeks gestation of pregnancy                Z3A.27  Pre-existing diabetes, type 2, in pregnancy,   O24.112  second trimester (no meds)  Encounter for antenatal screening for          Z36.3  malformations  Hypertension - Chronic/Pre-existing            O10.019  Obesity complicating pregnancy, second         O99.212  trimester (BMI 60)  Other mental disorder complicating             O99.340  pregnancy, second trimester  Maternal alcohol use complicating  O99.310 F10.99  pregnancy, antepartum (Early ETOH  exposure)  Genetic carrier (Sickle Cell Trait)            Z14.8  LR NIPS/ Negative AFP  Maternal care for known or suspected poor      O36.5920  fetal growth, second trimester, not applicable  or unspecified IUGR ---------------------------------------------------------------------- Fetal Evaluation  Num Of Fetuses:         1  Fetal Heart Rate(bpm):  153  Cardiac Activity:       Observed  Presentation:           Breech  Placenta:               Anterior  P. Cord Insertion:      Previously Visualized                              Largest Pocket(cm)                              5.8 ---------------------------------------------------------------------- Biometry  BPD:        64  mm     G. Age:  25w 6d        4.5  %    CI:        68.62   %    70 - 86                                                          FL/HC:      19.7   %    18.6 - 20.4  HC:      246.9  mm     G. Age:  26w 6d          9  %    HC/AC:      1.13        1.05 - 1.21  AC:      217.8  mm     G. Age:  26w 2d         12  %    FL/BPD:     76.1   %    71 - 87  FL:       48.7  mm     G. Age:  26w 3d         11  %    FL/AC:      22.4   %    20 - 24  LV:        4.3  mm  Est. FW:     921  gm           2 lb      8  % ---------------------------------------------------------------------- OB History  Blood Type:   AB+  Gravidity:    2         Term:   1        Prem:   0         SAB:   0  TOP:          0       Ectopic:  0        Living: 1 ---------------------------------------------------------------------- Gestational Age  LMP:  27w 3d        Date:  04/25/20                 EDD:   01/30/21  U/S Today:     26w 3d                                        EDD:   02/06/21  Best:          27w 3d     Det. By:  LMP  (04/25/20)          EDD:   01/30/21 ---------------------------------------------------------------------- Anatomy  Cranium:               Appears normal         LVOT:                   Appears normal  Cavum:                 Appears normal         Aortic Arch:            Appears normal  Ventricles:            Appears normal         Ductal Arch:            Appears normal  Choroid Plexus:        Appears normal         Diaphragm:              Appears normal  Cerebellum:            Previously seen        Stomach:                Appears normal, left                                                                        sided  Posterior Fossa:       Previously seen        Abdomen:                Appears normal  Nuchal Fold:           Not applicable (>20    Abdominal Wall:         Previously seen                         wks GA)  Face:                  Orbits and profile     Cord Vessels:           Previously seen                         previously seen  Lips:                  Not well visualized    Kidneys:                Appear  normal  Palate:                Not well visualized    Bladder:                Appears normal  Thoracic:              Appears normal         Spine:                  Not well visualized  Heart:                 Appears normal         Upper Extremities:      Previously seen                         (4CH, axis, and                         situs)  RVOT:                  Appears normal         Lower Extremities:      Previously seen  Other:  Fetus appears to be female. Nasal bone, Heels and Open hands          previously visualized. Technically difficult due to  maternal habitus and          fetal position. Lenses not well visualized. ---------------------------------------------------------------------- Doppler - Fetal Vessels  Umbilical Artery   S/D     %tile      RI    %tile      PI    %tile     PSV    ADFV    RDFV                                                     (cm/s)   2.62       24    0.62       27    0.91       26    41.54      No      No ---------------------------------------------------------------------- Cervix Uterus Adnexa  Cervix  Not visualized (advanced GA >24wks)  Right Ovary  Size(cm)     4.64   x   2.56   x  2.36      Vol(ml): 14.68  Within normal limits.  Left Ovary  Size(cm)       3.7  x   2.44   x  1.66      Vol(ml): 7.85  Within normal limits.  Adnexa  No abnormality visualized. ---------------------------------------------------------------------- Impression  Type 2 diabetes.  Reportedly well-controlled without  medications.  Patient has a fetal echo appointment on  12/16/2020.  Chronic hypertension.  Well-controlled without  antihypertensives.  Blood pressure today at her office is  140/72 mmHg  On today's ultrasound, amniotic fluid is normal good fetal  activity seen.  The estimated fetal weight is at the 8  percentile.  Interval weight gain over 4 weeks is 387 g  (suboptimal growth).  Umbilical artery Doppler showed normal  forward diastolic flow.  I explained the finding of fetal growth restriction and the most  likely cause is  placental insufficiency.  It is, however, difficult  to differentiate from a constitutionally small fetus.  I explained  the ultrasound protocol of monitoring fetal growth restriction. ---------------------------------------------------------------------- Recommendations  - UA Doppler in 2 weeks.  -Fetal growth and UA Doppler in 3 weeks. ----------------------------------------------------------------------                  Noralee Space, MD Electronically Signed Final Report   11/03/2020 06:08 pm  ----------------------------------------------------------------------  [place] MFM UA CORD DOPPLER  Result Date: 11/03/2020 ----------------------------------------------------------------------  OBSTETRICS REPORT                       (Signed Final 11/03/2020 06:08 pm) ---------------------------------------------------------------------- Patient Info  ID #:       [id number]                          D.O.B.:  1995/08/09 (24 yrs)  Name:       Susan Kaiser                 Visit Date: 11/03/2020 02:43 pm ---------------------------------------------------------------------- Performed By  Attending:        Noralee Space MD        Ref. Address:     [address]                                                             [place], [place]                                                             [address]  Performed By:     Redgie Grayer      [facility]  Referred By:      [facility] ---------------------------------------------------------------------- Orders  #  Description  Code        Ordered By  1  [place] MFM OB FOLLOW UP                   [id number]    RAVI SHANKAR  2  [place] MFM UA CORD DOPPLER                [id number]    RAVI [facility] ----------------------------------------------------------------------  #  Order #                     Accession #                Episode #  1  [id number]                   [id number]                 [id number]  2  [id number]                   [id number]                 [id number] ---------------------------------------------------------------------- Indications  [redacted] weeks gestation of pregnancy                Z3A.27  Pre-existing diabetes, type 2, in pregnancy,   O24.112  second trimester (no meds)   Encounter for antenatal screening for          Z36.3  malformations  Hypertension - Chronic/Pre-existing            O10.019  Obesity complicating pregnancy, second         O99.212  trimester (BMI 60)  Other mental disorder complicating             O99.340  pregnancy, second trimester  Maternal alcohol use complicating              O99.310 F10.99  pregnancy, antepartum (Early ETOH  exposure)  Genetic carrier (Sickle Cell Trait)            Z14.8  LR NIPS/ Negative AFP  Maternal care for known or suspected poor      O36.5920  fetal growth, second trimester, not applicable  or unspecified IUGR ---------------------------------------------------------------------- Fetal Evaluation  Num Of Fetuses:         1  Fetal Heart Rate(bpm):  153  Cardiac Activity:       Observed  Presentation:           Breech  Placenta:               Anterior  P. Cord Insertion:      Previously Visualized                              Largest Pocket(cm)                              5.8 ---------------------------------------------------------------------- Biometry  BPD:        64  mm     G. Age:  25w 6d        4.5  %    CI:        68.62   %    70 - 86  FL/HC:      19.7   %    18.6 - 20.4  HC:      246.9  mm     G. Age:  26w 6d          9  %    HC/AC:      1.13        1.05 - 1.21  AC:      217.8  mm     G. Age:  26w 2d         12  %    FL/BPD:     76.1   %    71 - 87  FL:       48.7  mm     G. Age:  26w 3d         11  %    FL/AC:      22.4   %    20 - 24  LV:        4.3  mm  Est. FW:     921  gm           2 lb      8  % ---------------------------------------------------------------------- OB History  Blood Type:   AB+  Gravidity:    2         Term:   1        Prem:   0        SAB:   0  TOP:          0       Ectopic:  0        Living: 1 ---------------------------------------------------------------------- Gestational Age  LMP:           27w 3d        Date:  04/25/20                 EDD:    01/30/21  U/S Today:     26w 3d                                        EDD:   02/06/21  Best:          27w 3d     Det. By:  LMP  (04/25/20)          EDD:   01/30/21 ---------------------------------------------------------------------- Anatomy  Cranium:               Appears normal         LVOT:                   Appears normal  Cavum:                 Appears normal         Aortic Arch:            Appears normal  Ventricles:            Appears normal         Ductal Arch:            Appears normal  Choroid Plexus:        Appears normal         Diaphragm:              Appears normal  Cerebellum:  Previously seen        Stomach:                Appears normal, left                                                                        sided  Posterior Fossa:       Previously seen        Abdomen:                Appears normal  Nuchal Fold:           Not applicable (>20    Abdominal Wall:         Previously seen                         wks GA)  Face:                  Orbits and profile     Cord Vessels:           Previously seen                         previously seen  Lips:                  Not well visualized    Kidneys:                Appear normal  Palate:                Not well visualized    Bladder:                Appears normal  Thoracic:              Appears normal         Spine:                  Not well visualized  Heart:                 Appears normal         Upper Extremities:      Previously seen                         (4CH, axis, and                         situs)  RVOT:                  Appears normal         Lower Extremities:      Previously seen  Other:  Fetus appears to be female. Nasal bone, Heels and Open hands          previously visualized. Technically difficult due to maternal habitus and          fetal position. Lenses not well visualized. ---------------------------------------------------------------------- Doppler - Fetal Vessels  Umbilical Artery   S/D     %tile      RI    %tile       PI    %tile  PSV    ADFV    RDFV                                                     (cm/s)   2.62       24    0.62       27    0.91       26    41.54      No      No ---------------------------------------------------------------------- Cervix Uterus Adnexa  Cervix  Not visualized (advanced GA >24wks)  Right Ovary  Size(cm)     4.64   x   2.56   x  2.36      Vol(ml): 14.68  Within normal limits.  Left Ovary  Size(cm)       3.7  x   2.44   x  1.66      Vol(ml): 7.85  Within normal limits.  Adnexa  No abnormality visualized. ---------------------------------------------------------------------- Impression  Type 2 diabetes.  Reportedly well-controlled without  medications.  Patient has a fetal echo appointment on  12/16/2020.  Chronic hypertension.  Well-controlled without  antihypertensives.  Blood pressure today at her office is  140/72 mmHg  On today's ultrasound, amniotic fluid is normal good fetal  activity seen.  The estimated fetal weight is at the 8  percentile.  Interval weight gain over 4 weeks is 387 g  (suboptimal growth).  Umbilical artery Doppler showed normal  forward diastolic flow.  I explained the finding of fetal growth restriction and the most  likely cause is placental insufficiency.  It is, however, difficult  to differentiate from a constitutionally small fetus.  I explained  the ultrasound protocol of monitoring fetal growth restriction. ---------------------------------------------------------------------- Recommendations  - UA Doppler in 2 weeks.  -Fetal growth and UA Doppler in 3 weeks. ----------------------------------------------------------------------                  Noralee Space, MD Electronically Signed Final Report   11/03/2020 06:08 pm ----------------------------------------------------------------------   Assessment and Plan:  Pregnancy: G2P1001 at [redacted]w[redacted]d 1. Supervision of other normal pregnancy, antepartum Good fetal movement  2. Pre-existing diabetes mellitus  affecting pregnancy, antepartum Reports CBGs as controlled - fastings less than 95 and 2hr PP in the 100-110 range.T Taking ASA  Has growth scans.  3. Chronic benign essential hypertension, antepartum Bp controlled off medications  4. BMI 60.0-69.9, adult (HCC)  5. 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 [redacted] weeks gestation of pregnancy  7. Anxiety and depression Increase zoloft - sertraline (ZOLOFT) 100 MG tablet; Take 1 tablet (100 mg total) by mouth daily.  Dispense: 90 tablet; Refill: 3  8. Fetal growth restriction antepartum Cord dopplers and growth scans.   Preterm labor symptoms and general obstetric precautions including but not limited to vaginal bleeding, contractions, leaking of fluid and fetal movement were reviewed in detail with the patient. I discussed the assessment and treatment plan with the patient. The patient was provided an opportunity to ask questions and all were answered. The patient agreed with the plan and demonstrated an understanding of the instructions. The patient was advised to call back or seek an in-person office evaluation/go to [facility] at [facility] for any urgent or concerning symptoms. Please refer to After Visit Summary for other counseling recommendations.  I provided 12 minutes of face-to-face time during this encounter.  No follow-ups on file.  Future Appointments  Date Time Provider Department Center  11/12/2020 10:55 AM Levie Heritage, DO [facility] None  11/17/2020  1:30 PM [facility] NURSE [facility]  11/17/2020  1:45 PM [facility] US6 [facility]  11/25/2020  2:30 PM [facility] NURSE [facility]  11/25/2020  2:45 PM [facility] US6 [facility]  11/26/2020 11:15 AM Levie Heritage, DO [facility] None  12/01/2020  2:45 PM [facility] NURSE [facility]  12/01/2020  3:00 PM [facility] US1 [facility]  12/09/2020  1:10 PM Levie Heritage, DO [facility] None  12/23/2020  1:10 PM Levie Heritage, DO [facility] None   01/06/2021  1:10 PM Levie Heritage, DO [facility] None    Levie Heritage, DO Center for [employer]

## 2020-11-17 ENCOUNTER — Ambulatory Visit: Payer: Managed Care, Other (non HMO) | Attending: Obstetrics and Gynecology

## 2020-11-17 ENCOUNTER — Ambulatory Visit: Payer: Managed Care, Other (non HMO) | Admitting: *Deleted

## 2020-11-17 ENCOUNTER — Other Ambulatory Visit: Payer: Self-pay

## 2020-11-17 VITALS — BP 131/61 | HR 100

## 2020-11-17 DIAGNOSIS — Z3480 Encounter for supervision of other normal pregnancy, unspecified trimester: Secondary | ICD-10-CM

## 2020-11-17 DIAGNOSIS — D573 Sickle-cell trait: Secondary | ICD-10-CM | POA: Insufficient documentation

## 2020-11-17 DIAGNOSIS — O10012 Pre-existing essential hypertension complicating pregnancy, second trimester: Secondary | ICD-10-CM

## 2020-11-17 DIAGNOSIS — Z3A29 29 weeks gestation of pregnancy: Secondary | ICD-10-CM

## 2020-11-17 DIAGNOSIS — E119 Type 2 diabetes mellitus without complications: Secondary | ICD-10-CM

## 2020-11-17 DIAGNOSIS — O99212 Obesity complicating pregnancy, second trimester: Secondary | ICD-10-CM

## 2020-11-17 DIAGNOSIS — E669 Obesity, unspecified: Secondary | ICD-10-CM

## 2020-11-17 DIAGNOSIS — O365920 Maternal care for other known or suspected poor fetal growth, second trimester, not applicable or unspecified: Secondary | ICD-10-CM | POA: Diagnosis not present

## 2020-11-17 DIAGNOSIS — Z6841 Body Mass Index (BMI) 40.0 and over, adult: Secondary | ICD-10-CM | POA: Insufficient documentation

## 2020-11-17 DIAGNOSIS — O24112 Pre-existing diabetes mellitus, type 2, in pregnancy, second trimester: Secondary | ICD-10-CM | POA: Insufficient documentation

## 2020-11-17 DIAGNOSIS — O10912 Unspecified pre-existing hypertension complicating pregnancy, second trimester: Secondary | ICD-10-CM | POA: Insufficient documentation

## 2020-11-17 DIAGNOSIS — O24319 Unspecified pre-existing diabetes mellitus in pregnancy, unspecified trimester: Secondary | ICD-10-CM | POA: Diagnosis present

## 2020-11-17 DIAGNOSIS — O9902 Anemia complicating childbirth: Secondary | ICD-10-CM | POA: Diagnosis present

## 2020-11-24 ENCOUNTER — Other Ambulatory Visit: Payer: Self-pay

## 2020-11-24 ENCOUNTER — Encounter (HOSPITAL_COMMUNITY): Payer: Self-pay | Admitting: Obstetrics and Gynecology

## 2020-11-24 ENCOUNTER — Telehealth: Payer: Self-pay

## 2020-11-24 ENCOUNTER — Inpatient Hospital Stay (HOSPITAL_COMMUNITY)
Admission: AD | Admit: 2020-11-24 | Discharge: 2020-11-24 | Disposition: A | Discharge status: Home / Self Care | Payer: Managed Care, Other (non HMO) | Attending: Obstetrics and Gynecology | Admitting: Obstetrics and Gynecology

## 2020-11-24 DIAGNOSIS — R102 Pelvic and perineal pain: Secondary | ICD-10-CM | POA: Insufficient documentation

## 2020-11-24 DIAGNOSIS — Z3A30 30 weeks gestation of pregnancy: Secondary | ICD-10-CM

## 2020-11-24 DIAGNOSIS — Z880 Allergy status to penicillin: Secondary | ICD-10-CM | POA: Diagnosis not present

## 2020-11-24 DIAGNOSIS — Z7982 Long term (current) use of aspirin: Secondary | ICD-10-CM | POA: Diagnosis not present

## 2020-11-24 DIAGNOSIS — M7918 Myalgia, other site: Secondary | ICD-10-CM | POA: Diagnosis not present

## 2020-11-24 DIAGNOSIS — O26893 Other specified pregnancy related conditions, third trimester: Secondary | ICD-10-CM

## 2020-11-24 DIAGNOSIS — Z3480 Encounter for supervision of other normal pregnancy, unspecified trimester: Secondary | ICD-10-CM

## 2020-11-24 DIAGNOSIS — R109 Unspecified abdominal pain: Secondary | ICD-10-CM | POA: Diagnosis not present

## 2020-11-24 DIAGNOSIS — O99891 Other specified diseases and conditions complicating pregnancy: Secondary | ICD-10-CM

## 2020-11-24 LAB — URINALYSIS, ROUTINE W REFLEX MICROSCOPIC
Bilirubin Urine: NEGATIVE
Glucose, UA: NEGATIVE mg/dL
Hgb urine dipstick: NEGATIVE
Ketones, ur: 5 mg/dL — AB
Leukocytes,Ua: NEGATIVE
Nitrite: NEGATIVE
Protein, ur: NEGATIVE mg/dL
Specific Gravity, Urine: 1.014 (ref 1.005–1.030)
pH: 7 (ref 5.0–8.0)

## 2020-11-24 NOTE — Discharge Instructions (Signed)
Source: [facility]  Seven Things to Know About the Pain from Pubic Symphysis By: Susan Kaiser, PT, DPT     Today we welcome guest writer, Susan Kaiser, PT,  DPT, a physical therapist specializing in pelvic floor health for the childbearing year.  I am grateful that the topic of pelvic floor health during pregnancy and postpartum is being discussed, as many people suffer in silence and without helpful resources. Greggory Stallion,  Medical sales representative, [employer]ing [employer]ots  Introduction Joint pain is a very common physical challenge resulting from pregnancy. In particular, pelvic joint pain affects 16 to 25% of pregnancies, with onset anywhere from the first to third trimester (Kanakaris, 2011). This article will give you answers to the seven most common questions childbirth educators get about pain in the pubic symphysis--the joint at the very front of the pelvis that expands as pregnancy progresses.    1. What does pubic symphysis pain feel like? Sometimes pubic symphysis pain can feel like a slight pinch or ache. Other times it hurts so much someone will not want to walk. In certain cases, the pain will not be over the pubic symphysis, but in the creases of the groins or along the inner thighs. Occasionally, pain is only felt on one side of the body. Pubic symphysis joint pain is commonly provoked by moving the legs apart, such as getting in and out of a car, climbing out of bed, rolling in bed, or going up and down stairs. Standing up from prolonged sitting, particularly on a soft couch, is another known trigger.  2. Do I need to tell my midwife or doctor about my pain? Yes! Any time you have notable pain, you should be directed to a knowledgeable medical provider for diagnosis. The medical term for problematic pubic symphysis pain is symphysis pubis dysfunction (SPD).  3. Is pubic symphysis pain treatable? Yes! There are a number of at-home treatment options including rest, bracing, and following  self-help tips. Many people with pubic symphysis joint pain in pregnancy also benefit from physical therapy, massage therapy, acupuncture, or chiropractic care. Here's a list of self-help tips:   Walking: Walk with shorter steps to decrease pubic symphysis irritation. When carrying items, wear them in a backpack or hold them close to the chest to avoid uneven loading of the pelvis causing pain.    Sleep: Keep a pillow between the legs that spans from the knees to the ankles (not just between the knees).   Rolling over in bed: To go from one side to the other, (1) bend knees to 90 degrees, (2) tense the belly gently for stability (sometimes doing a Kegel helps too!), and (3) roll with knees, hips, and shoulders in line like a log. Avoid flopping side to side in bed or letting the spine twist.   Dressing: Sit down on the edge of the bed or on a chair to get dressed. Standing on one leg can irritate the pubic symphysis.   Stairs: Face the railing and try going up stairs stepping to the side. Alternatively, take the steps one at a time to avoid a large movement with the pelvis.   Car: When getting in a car, turn their back to the seat and aim their bottom in. Once seated, keep the knees close together as they slowly turn to face forward. Getting out is the same process in reverse. Keeping the knees close together helps prevent the pubic symphysis from feeling pulled on.   Sitting: Rest as evenly as  possible on their sit bones. Consider putting a pillow behind the back for support. Avoid slumping or leaning on one sit bone more than the other. Sitting on a large exercise ball (about 65 cm) may offer relief of pain. Avoid sitting cross legged on the floor.   Sexual activities: Aim to keep the knees together as much as possible. This may require some creativity for positioning (think spooning or using a chair).   4. Are support belts a good idea?  Most, but not all, people with SPD feel less pain and  have an easier time walking while wearing a support belt. One of the most common support belts for SPD treatment is the Serola Sacroiliac Belt. Another common support belt is the Upsie Belly. Many support belts can be purchased using a Health Savings Account (HSA) or Flexible Health Savings (FSA). Some people use a support belt just when they need more support, such as for a walk around the neighborhood. Other people wear the belt 23 hours per day, just taking it off to shower. Many people take them off for sitting. Sometimes people find that they need help from a physical therapist or chiropractor to adjust the pubic bones before splinting them with a brace.   5. What should I expect during birth? It is safe to labor with pain in the pubic symphysis joints, and this pain does not change the physiological process of birth. However, many birthing people find it helpful to adjust their positioning in labor and delivery to manage discomfort in the pubic symphysis joint. While going through contractions, positions that take weight off the pelvis may feel better. These positions include resting the upper body on an exercise ball, slow dance, being in a tub or pool, and sidelying. During pushing, hands and knees and sidelying are frequently more comfortable positions. Common positions to avoid if possible include one or both knees to chest, lunging, and climbing stairs.   6. What should I expect after I give birth?  Right after birth, the pubic symphysis pain may suddenly go away or it may feel worse from the physical rigors of labor. The first few days postpartum are often the most challenging, and a walker or wheelchair may be needed temporarily. Wearing a support belt is often helpful. If SPD is treated, most people get back to an active lifestyle by three to four months postpartum.   7. What about a future pregnancy? Joint pain that occurs during one pregnancy is worse during a subsequent pregnancy, unless  the underlying risk factors are addressed (Kanakaris, 2011). Early use of interventions to manage pain, such as a support belt or targeted exercise, can lead to a more satisfying pregnancy experience overall. On occasion, a person who experienced a traumatic pubic symphysis injury during birth will choose an elective cesarean for the following birth. A cesarean is not required in these cases, but it can help the birthing person to feel more in control and to avoid a triggering experience.     Resources Websites   [facility] (https://www.babycentre.co.uk/a546492/pelvic-pain-in-pregnancy-spd)  [place]'s National Health Service (https://www.nhs.uk/pregnancy/related-conditions/common-symptoms/pelvic-pain/)  At [facility], we have pelvic floor/female physical therapy specialists available. Ask your midwife or doctor.    Or here are other sites to search for therapists:   American [employer] Therapy Associations (https://ptl.womenshealthapta.org).   Global Pelvic Health Alliance (https://pelvicguru.com)  Kanakaris, N. K., Roberts, C. S., & Giannoudis, P. V. (2011). Pregnancy-related pelvic girdle pain: an update. BMC Medicine, 9(15). https://doi.org/10.1186/1741-7013-10-21  

## 2020-11-24 NOTE — MAU Note (Signed)
Presents stating she's having "severe" lower abdominal pain that began this morning @ 0600.  Reports pain is with movement.  Denies VB or LOF.  Reports no FM since 0600 this morning. States hasn't eaten today.

## 2020-11-24 NOTE — MAU Provider Note (Addendum)
Susan Kaiser is a 25 y.o. G2P1001, [redacted]w[redacted]d with a past medical history of anxiety, depression and prediabetes who presents for abdominal pain.   History  Patient states that the pain began this morning at 6 AM. She was just walking around and can think of nothing that could have caused it. This pain has never occurred before in the pregnancy or the prior one. She described the pain midline and shooting down from the lower abdomen to her vagina. She states it only occurs when she moves, and is different from contraction pain. The pain is severe enough that she vomited earlier today, and struggles to walk. She denies any headache, nausea, fevers, chills, dysuria. She has passed urine earlier today but has not passed stool in the last two days which is normal for her during this pregnancy.   CSN: [id number]  Arrival date and time: 11/24/20 1512   None     Chief Complaint  Patient presents with   Abdominal Pain   Abdominal Pain Associated symptoms include vomiting. Pertinent negatives include no diarrhea, dysuria, fever, hematuria or nausea.   OB History     Gravida  2   Para  1   Term  1   Preterm      AB      Living  1      SAB      IAB      Ectopic      Multiple      Live Births  1           Past Medical History:  Diagnosis Date   Depression    Diabetes mellitus without complication (HCC)    Dysmenorrhea 09/17/2014   Generalized anxiety disorder    Menorrhagia 04/21/2013   Menstrual extraction 05/26/2013   Obesity    PCO (polycystic ovaries) 06/10/2013   Unspecified symptom associated with female genital organs 06/10/2013    Past Surgical History:  Procedure Laterality Date   EXTERNAL EAR SURGERY     stuck a corn kernnel in her ear when she was 4     Family History  Problem Relation Age of Onset   Diabetes Paternal Aunt    Diabetes Paternal Uncle    Diabetes Maternal Grandmother    Diabetes Maternal Grandfather    Other Mother        cysts on  ovaries   Heart disease Father     Social History   Tobacco Use   Smoking status: Never   Smokeless tobacco: Never  Vaping Use   Vaping Use: Never used  Substance Use Topics   Alcohol use: No   Drug use: No    Allergies:  Allergies  Allergen Reactions   Amoxicillin Anaphylaxis, Itching and Rash    Medications Prior to Admission  Medication Sig Dispense Refill Last Dose   Accu-Chek Softclix Lancets lancets 100 each by Other route 4 (four) times daily. 100 each 12    aspirin EC 81 MG tablet Take 81 mg by mouth daily. Swallow whole.      Doxylamine-Pyridoxine 10-10 MG TBEC Take 2 tablets by mouth at bedtime. If needed, take 1 additional tablet in the morning and 1 additional tablet in the afternoon. Max of 4 tablets per day 120 tablet 0    glucose blood (ACCU-CHEK GUIDE) test strip DX: [id number]; check BS QID 100 each 12    prenatal vitamin w/FE, FA (NATACHEW) 29-1 MG CHEW chewable tablet Chew 1 tablet by mouth daily at 12 noon.  sertraline (ZOLOFT) 100 MG tablet Take 1 tablet (100 mg total) by mouth daily. 90 tablet 3     Review of Systems  Constitutional:  Negative for chills, diaphoresis and fever.  HENT:  Negative for congestion.   Eyes:  Negative for visual disturbance.  Respiratory:  Negative for cough, chest tightness and shortness of breath.   Cardiovascular:  Negative for chest pain and leg swelling.  Gastrointestinal:  Positive for abdominal pain and vomiting. Negative for abdominal distention, diarrhea, nausea and rectal pain.  Genitourinary:  Negative for difficulty urinating, dysuria, flank pain, hematuria, urgency, vaginal bleeding and vaginal discharge.  Skin:  Negative for rash.  Physical Exam   Blood pressure 125/60, pulse (!) 105, temperature 98.1 F (36.7 C), temperature source Oral, resp. rate 19, height 5\' 10"  (1.778 m), weight (!) 187.5 kg, last menstrual period 04/25/2020, SpO2 99 %.  Physical Exam Vitals and nursing note reviewed.   Constitutional:      Appearance: She is obese.  HENT:     Head: Normocephalic and atraumatic.  Cardiovascular:     Rate and Rhythm: Normal rate and regular rhythm.  Pulmonary:     Effort: Pulmonary effort is normal.     Breath sounds: Normal breath sounds.  Abdominal:     Tenderness: There is no abdominal tenderness.     Comments: No pain on deep palpation  Skin:    General: Skin is warm and dry.  Neurological:     General: No focal deficit present.     Mental Status: She is alert.  Psychiatric:        Mood and Affect: Mood normal.        Behavior: Behavior normal.    MAU Course  Procedures  MDM 4:56 U/A collected and pending. 5:13 cervical exam performed and patient is not dilated. UA is normal.  Assessment and Plan  Susan Kaiser is a 25 y.o. G2P1001, [redacted]w[redacted]d with a past medical history of anxiety, depression and prediabetes who presents for abdominal pain.  Abdominal Pain: Ddx includes preterm labor, round ligament pain, pubic symphysis separation and infection. Lack of contractions and undilated cervix makes preterm labor less likely. Patient denies any dysuria or urinary changes making UTI less likely. Symptoms are consistent with pubic symphysis pain, with the midline location and pain with hip movement. Patient counseled on how to handle this pain and referred to physical therapy through her OB office.  [redacted]w[redacted]d 11/24/2020, 4:37 PM   CNM attestation:  I have seen and examined this patient and agree with above documentation in the medical student's note.   Susan Kaiser is a 25 y.o. G2P1001 at [redacted]w[redacted]d reporting lower abdominal pain.  +FM, denies LOF, VB, contractions, vaginal discharge.  PE: Patient Vitals for the past 24 hrs:  BP Temp Temp src Pulse Resp SpO2 Height Weight  11/24/20 1712 134/63 -- -- 97 -- -- -- --  11/24/20 1552 125/60 -- -- (!) 105 -- -- -- --  11/24/20 1529 (!) 145/86 98.1 F (36.7 C) Oral (!) 109 19 99 % 5\' 10"  (1.778 m) (!) 187.5  kg   Gen: calm comfortable, NAD Resp: normal effort, no distress Heart: Regular rate Abd: Soft, NT, gravid, S=D  FHR: Baseline 150, moderate Variability, pos accels, no decels Toco: no contractions on toco or to palpation  ROS, labs, PMH reviewed  Orders Placed This Encounter  Procedures   Urinalysis, Routine w reflex microscopic Urine, Clean Catch   Ambulatory referral to Physical Therapy  Discharge patient   No orders of the defined types were placed in this encounter.   MDM No evidence of preterm labor with closed cervix. Pt pain c/w MSK, pubic symphysis pain.  Rest/ice/heat/warm bath/increase PO fluids/Tylenol/pregnancy support belt for pain.  Referral to PT, message sent to [facility].    Assessment: 1. 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 Supervision of other normal pregnancy, antepartum   3. Abdominal pain during pregnancy in third trimester   4. [redacted] weeks gestation of pregnancy     Plan: - Discharge home in stable condition. - PTL precautions and fetal kick counts. - Follow-up as scheduled at your doctor's office for next prenatal visit or sooner as needed if symptoms worsen. - Return to maternity admissions symptoms worsen  Hurshel Party, CNM 11/24/2020 5:40 PM

## 2020-11-24 NOTE — Telephone Encounter (Signed)
Patient called and is approx 30 weeks. Patient states she was fine this morning but as the day has gone on she is in so much pain she can bearly get up and walk. Patient denies any bleeding. Patient reports good fetal movement. Patient advised to seek care at maternity Admisisons unit at [facility] (entrance C). Patient states understanding and agrees with plan. Armandina Stammer RN

## 2020-11-25 ENCOUNTER — Ambulatory Visit: Payer: Managed Care, Other (non HMO)

## 2020-11-26 ENCOUNTER — Other Ambulatory Visit: Payer: Self-pay

## 2020-11-26 ENCOUNTER — Ambulatory Visit (INDEPENDENT_AMBULATORY_CARE_PROVIDER_SITE_OTHER): Payer: Managed Care, Other (non HMO) | Admitting: Family Medicine

## 2020-11-26 ENCOUNTER — Telehealth: Payer: Self-pay | Admitting: Family Medicine

## 2020-11-26 VITALS — BP 114/66 | HR 100 | Wt >= 6400 oz

## 2020-11-26 DIAGNOSIS — Z3480 Encounter for supervision of other normal pregnancy, unspecified trimester: Secondary | ICD-10-CM

## 2020-11-26 DIAGNOSIS — Z3A30 30 weeks gestation of pregnancy: Secondary | ICD-10-CM

## 2020-11-26 DIAGNOSIS — F411 Generalized anxiety disorder: Secondary | ICD-10-CM

## 2020-11-26 DIAGNOSIS — I10 Essential (primary) hypertension: Secondary | ICD-10-CM

## 2020-11-26 DIAGNOSIS — D573 Sickle-cell trait: Secondary | ICD-10-CM

## 2020-11-26 DIAGNOSIS — O9902 Anemia complicating childbirth: Secondary | ICD-10-CM

## 2020-11-26 DIAGNOSIS — E119 Type 2 diabetes mellitus without complications: Secondary | ICD-10-CM

## 2020-11-26 DIAGNOSIS — O24319 Unspecified pre-existing diabetes mellitus in pregnancy, unspecified trimester: Secondary | ICD-10-CM

## 2020-11-26 DIAGNOSIS — O10019 Pre-existing essential hypertension complicating pregnancy, unspecified trimester: Secondary | ICD-10-CM

## 2020-11-26 NOTE — Telephone Encounter (Signed)
error 

## 2020-11-26 NOTE — Progress Notes (Signed)
   PRENATAL VISIT NOTE  Subjective:  Susan Kaiser is a 25 y.o. G2P1001 at [redacted]w[redacted]d being seen today for ongoing prenatal care.  She is currently monitored for the following issues for this high-risk pregnancy and has Obesity affecting pregnancy, antepartum; PCO (polycystic ovaries); Supervision of other normal pregnancy, antepartum; Chronic benign essential hypertension, antepartum; Pre-existing diabetes mellitus affecting pregnancy, antepartum; BMI 60.0-69.9, adult (HCC); 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 [facility]); GAD (generalized anxiety disorder); Hypertension; Post partum depression; Recurrent major depressive disorder (HCC); and Type 2 diabetes mellitus without complication, without long-term current use of insulin (HCC) on their problem list.  Patient reports no complaints.  Contractions: Not present. Vag. Bleeding: None.  Movement: Present. Denies leaking of fluid.   The following portions of the patient's history were reviewed and updated as appropriate: allergies, current medications, past family history, past medical history, past social history, past surgical history and problem list.   Objective:   Vitals:   11/26/20 1120  BP: 114/66  Pulse: 100  Weight: (!) 418 lb (189.6 kg)    Fetal Status: Fetal Heart Rate (bpm): 158   Movement: Present     General:  Alert, oriented and cooperative. Patient is in no acute distress.  Skin: Skin is warm and dry. No rash noted.   Cardiovascular: Normal heart rate noted  Respiratory: Normal respiratory effort, no problems with respiration noted  Abdomen: Soft, gravid, appropriate for gestational age.  Pain/Pressure: Absent     Pelvic: Cervical exam deferred        Extremities: Normal range of motion.  Edema: None  Mental Status: Normal mood and affect. Normal behavior. Normal judgment and thought content.   Assessment and Plan:  Pregnancy: G2P1001 at [redacted]w[redacted]d 1. [redacted] weeks gestation of pregnancy  2. Supervision of other normal  pregnancy, antepartum FHT and FH normal  3. Chronic benign essential hypertension, antepartum Controlled. On ASA 81mg   4. 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 (HCC)  5. Pre-existing diabetes mellitus affecting pregnancy, antepartum Reports controlled: fastings less than 95 and 2hr PP less than 120.   6. GAD (generalized anxiety disorder)  As a note of correction - patient did not drink alcohol during beginning of pregnancy. She was an occasional drinker and had stopped drinking when she became pregnant.   Preterm labor symptoms and general obstetric precautions including but not limited to vaginal bleeding, contractions, leaking of fluid and fetal movement were reviewed in detail with the patient. Please refer to After Visit Summary for other counseling recommendations.   No follow-ups on file.  Future Appointments  Date Time Provider Department Center  12/01/2020  2:45 PM [facility] NURSE [facility]  12/01/2020  3:00 PM [facility] US[facility]  12/09/2020  1:10 PM 13/04/2020, DO [facility] None  12/23/2020  1:10 PM 12/25/2020, DO [facility] None  01/06/2021  1:10 PM 14/02/2020, DO [facility] None    Levie Heritage, DO

## 2020-12-01 ENCOUNTER — Ambulatory Visit: Payer: Managed Care, Other (non HMO) | Admitting: *Deleted

## 2020-12-01 ENCOUNTER — Other Ambulatory Visit: Payer: Self-pay

## 2020-12-01 ENCOUNTER — Encounter: Payer: Self-pay | Admitting: *Deleted

## 2020-12-01 ENCOUNTER — Ambulatory Visit: Payer: Managed Care, Other (non HMO) | Attending: Obstetrics and Gynecology

## 2020-12-01 ENCOUNTER — Other Ambulatory Visit: Payer: Self-pay | Admitting: Obstetrics and Gynecology

## 2020-12-01 VITALS — BP 119/66 | HR 98

## 2020-12-01 DIAGNOSIS — O24112 Pre-existing diabetes mellitus, type 2, in pregnancy, second trimester: Secondary | ICD-10-CM

## 2020-12-01 DIAGNOSIS — Z6841 Body Mass Index (BMI) 40.0 and over, adult: Secondary | ICD-10-CM

## 2020-12-01 DIAGNOSIS — Z3480 Encounter for supervision of other normal pregnancy, unspecified trimester: Secondary | ICD-10-CM | POA: Insufficient documentation

## 2020-12-01 DIAGNOSIS — O365930 Maternal care for other known or suspected poor fetal growth, third trimester, not applicable or unspecified: Secondary | ICD-10-CM

## 2020-12-01 DIAGNOSIS — O10013 Pre-existing essential hypertension complicating pregnancy, third trimester: Secondary | ICD-10-CM

## 2020-12-01 DIAGNOSIS — O9902 Anemia complicating childbirth: Secondary | ICD-10-CM | POA: Insufficient documentation

## 2020-12-01 DIAGNOSIS — D573 Sickle-cell trait: Secondary | ICD-10-CM | POA: Insufficient documentation

## 2020-12-01 DIAGNOSIS — O10912 Unspecified pre-existing hypertension complicating pregnancy, second trimester: Secondary | ICD-10-CM

## 2020-12-01 DIAGNOSIS — O24319 Unspecified pre-existing diabetes mellitus in pregnancy, unspecified trimester: Secondary | ICD-10-CM | POA: Diagnosis present

## 2020-12-01 DIAGNOSIS — O99213 Obesity complicating pregnancy, third trimester: Secondary | ICD-10-CM

## 2020-12-01 DIAGNOSIS — O365920 Maternal care for other known or suspected poor fetal growth, second trimester, not applicable or unspecified: Secondary | ICD-10-CM

## 2020-12-01 DIAGNOSIS — E669 Obesity, unspecified: Secondary | ICD-10-CM

## 2020-12-01 DIAGNOSIS — O24113 Pre-existing diabetes mellitus, type 2, in pregnancy, third trimester: Secondary | ICD-10-CM | POA: Diagnosis not present

## 2020-12-01 DIAGNOSIS — E119 Type 2 diabetes mellitus without complications: Secondary | ICD-10-CM

## 2020-12-01 DIAGNOSIS — Z3A31 31 weeks gestation of pregnancy: Secondary | ICD-10-CM

## 2020-12-02 ENCOUNTER — Other Ambulatory Visit: Payer: Self-pay | Admitting: *Deleted

## 2020-12-02 DIAGNOSIS — O24113 Pre-existing diabetes mellitus, type 2, in pregnancy, third trimester: Secondary | ICD-10-CM

## 2020-12-02 DIAGNOSIS — Z3689 Encounter for other specified antenatal screening: Secondary | ICD-10-CM

## 2020-12-02 DIAGNOSIS — Z6841 Body Mass Index (BMI) 40.0 and over, adult: Secondary | ICD-10-CM

## 2020-12-09 ENCOUNTER — Ambulatory Visit: Payer: Managed Care, Other (non HMO) | Admitting: *Deleted

## 2020-12-09 ENCOUNTER — Other Ambulatory Visit: Payer: Self-pay

## 2020-12-09 ENCOUNTER — Telehealth (INDEPENDENT_AMBULATORY_CARE_PROVIDER_SITE_OTHER): Payer: Managed Care, Other (non HMO) | Admitting: Family Medicine

## 2020-12-09 ENCOUNTER — Ambulatory Visit: Payer: Self-pay

## 2020-12-09 VITALS — BP 132/76 | HR 104

## 2020-12-09 VITALS — BP 113/70

## 2020-12-09 DIAGNOSIS — E119 Type 2 diabetes mellitus without complications: Secondary | ICD-10-CM

## 2020-12-09 DIAGNOSIS — O99210 Obesity complicating pregnancy, unspecified trimester: Secondary | ICD-10-CM

## 2020-12-09 DIAGNOSIS — O99013 Anemia complicating pregnancy, third trimester: Secondary | ICD-10-CM

## 2020-12-09 DIAGNOSIS — D573 Sickle-cell trait: Secondary | ICD-10-CM

## 2020-12-09 DIAGNOSIS — O24319 Unspecified pre-existing diabetes mellitus in pregnancy, unspecified trimester: Secondary | ICD-10-CM

## 2020-12-09 DIAGNOSIS — Z3480 Encounter for supervision of other normal pregnancy, unspecified trimester: Secondary | ICD-10-CM

## 2020-12-09 DIAGNOSIS — O24313 Unspecified pre-existing diabetes mellitus in pregnancy, third trimester: Secondary | ICD-10-CM

## 2020-12-09 DIAGNOSIS — Z3A32 32 weeks gestation of pregnancy: Secondary | ICD-10-CM

## 2020-12-09 DIAGNOSIS — Z6841 Body Mass Index (BMI) 40.0 and over, adult: Secondary | ICD-10-CM

## 2020-12-09 NOTE — Progress Notes (Signed)

## 2020-12-09 NOTE — Progress Notes (Signed)
OBSTETRICS PRENATAL VIRTUAL VISIT ENCOUNTER NOTE  Provider location: [facility] [place] at [facility]   Patient location: Home  I connected with Susan Kaiser on 12/09/20 at  1:10 PM EDT by MyChart Video Encounter and verified that I am speaking with the correct person using two identifiers. I discussed the limitations, risks, security and privacy concerns of performing an evaluation and management service virtually and the availability of in person appointments. I also discussed with the patient that there may be a patient responsible charge related to this service. The patient expressed understanding and agreed to proceed. Subjective:  Susan Kaiser is a 25 y.o. G2P1001 at [redacted]w[redacted]d being seen today for ongoing prenatal care.  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  Patient reports  starting to get more uncomfortable .  Contractions: Irritability. Vag. Bleeding: None.  Movement: Present. Denies any leaking of fluid.   The following portions of the patient's history were reviewed and updated as appropriate: allergies, current medications, past family history, past medical history, past social history, past surgical history and problem list.   Objective:   Vitals:   12/09/20 1309  BP: 113/70    Fetal Status:     Movement: Present     General:  Alert, oriented and cooperative.  Patient is in no acute distress.  Respiratory: Normal respiratory effort, no problems with respiration noted  Mental Status: Normal mood and affect. Normal behavior. Normal judgment and thought content.  Rest of physical exam deferred due to type of encounter  Imaging: [place] MFM OB FOLLOW UP  Result Date: 12/01/2020 ----------------------------------------------------------------------  OBSTETRICS REPORT                       (Signed Final 12/01/2020 04:27 pm) ---------------------------------------------------------------------- Patient Info  ID #:       [id number]                          D.O.B.:  [birth year] (25 yrs)  Name:       Susan Kaiser                 Visit Date: 12/01/2020 03:03 pm ---------------------------------------------------------------------- Performed By  Attending:        Tama High MD        Ref. Address:     [place]  Performed By:     Dorena Dew     [facility]                    BS, RDMS  Fetal Care at                                                             [place] for                                                             Women  Referred By:      [facility] [place] ---------------------------------------------------------------------- Orders  #  Description                           Code        Ordered By  1  [place] MFM UA CORD DOPPLER                76820.02    RAVI SHANKAR  2  [place] MFM OB FOLLOW UP                   [id number]    RAVI [facility] ----------------------------------------------------------------------  #  Order #                     Accession #                Episode #  1  [id number]                   [id number]                 [id number]  2  [id number]                   [id number]                 [id number] ---------------------------------------------------------------------- Indications  Pre-existing diabetes, type 2, in pregnancy,    O24.113  third trimester  Hypertension - Chronic/Pre-existing            O10.019  [redacted] weeks gestation of pregnancy                Z3A.48  Maternal care for known or suspected poor      O36.5931  fetal growth, third trimester, fetus 1 IUGR  Pre-existing essential hypertension            123XX123  complicating pregnancy, third trimester  Encounter for antenatal screening for          Z36.3  malformations  Obesity complicating pregnancy, second         O99.212  trimester (BMI 60)  Other mental disorder complicating             O99.340  pregnancy, second trimester  Maternal alcohol use complicating              O99.310 F10.99  pregnancy, antepartum (Early ETOH  exposure)  Genetic carrier (Sickle Cell Trait)            Z14.8  LR NIPS/ Negative AFP ---------------------------------------------------------------------- Fetal Evaluation  Num Of Fetuses:         1  Fetal Heart Rate(bpm):  158  Cardiac Activity:  Observed  Presentation:           Cephalic  Placenta:               Anterior  P. Cord Insertion:      Previously Visualized  Amniotic Fluid  AFI FV:      Within normal limits  AFI Sum(cm)     %Tile       Largest Pocket(cm)  16.86           62          5.47  RUQ(cm)       RLQ(cm)       LUQ(cm)        LLQ(cm)  5.47          4.89          2.61           3.89 ---------------------------------------------------------------------- Biometry  BPD:      76.8  mm     G. Age:  30w 6d         22  %    CI:        73.02   %    70 - 86                                                          FL/HC:      21.2   %    19.3 - 21.3  HC:      285.7  mm     G. Age:  31w 3d         14  %    HC/AC:      1.05        0.96 - 1.17  AC:      273.1  mm     G. Age:  31w 3d         57  %    FL/BPD:     79.0   %    71 - 87  FL:       60.7  mm     G. Age:  31w 4d         39  %    FL/AC:      22.2   %    20 - 24  HUM:      53.9  mm     G. Age:  31w 2d         50  %  LV:        2.4  mm  Est. FW:    1756  gm    3 lb 14 oz      36  %  ---------------------------------------------------------------------- OB History  Blood Type:   AB+  Gravidity:    2         Term:   1        Prem:   0        SAB:   0  TOP:          0       Ectopic:  0        Living: 1 ---------------------------------------------------------------------- Gestational Age  LMP:           31w 3d        Date:  04/25/20  EDD:   01/30/21  U/S Today:     31w 2d                                        EDD:   01/31/21  Best:          31w 3d     Det. By:  LMP  (04/25/20)          EDD:   01/30/21 ---------------------------------------------------------------------- Anatomy  Cranium:               Appears normal         LVOT:                   Previously seen  Cavum:                 Previously seen        Aortic Arch:            Previously seen  Ventricles:            Appears normal         Ductal Arch:            Previously seen  Choroid Plexus:        Previously seen        Diaphragm:              Appears normal  Cerebellum:            Previously seen        Stomach:                Appears normal, left                                                                        sided  Posterior Fossa:       Previously seen        Abdomen:                Previously seen  Nuchal Fold:           Not applicable (Q000111Q    Abdominal Wall:         Previously seen                         wks GA)  Face:                  Orbits and profile     Cord Vessels:           Previously seen                         previously seen  Lips:                  Not well visualized    Kidneys:                Appear normal  Palate:                Not well visualized    Bladder:  Appears normal  Thoracic:              Previously seen        Spine:                  Not well visualized  Heart:                 Appears normal         Upper Extremities:      Previously seen                         (4CH, axis, and                         situs)  RVOT:                  Previously seen        Lower  Extremities:      Previously seen  Other:  Female gender previously seen. Nasal bone, Heels and Open hands          previously visualized. Technically difficult due to maternal habitus and          fetal position. Lenses not well visualized. ---------------------------------------------------------------------- Doppler - Fetal Vessels  Umbilical Artery   S/D     %tile      RI    %tile      PI    %tile            ADFV    RDFV   2.31       24    0.57       25    0.79       22               No      No ---------------------------------------------------------------------- Impression  Patient returned for fetal growth assessment.  On ultrasound  performed 4 weeks ago, the estimated fetal weight was at the  8 percentile.  Pre-existing diabetes.  Patient reports her blood glucose  levels are within normal range and diabetes is diet controlled.  Today, her fasting level was increased at 106 mg/DL.  Chronic hypertension.  Well-controlled without  antihypertensives.  Blood pressure today at her office is  119/66 mmHg.  On today's ultrasound, amniotic fluid is normal good fetal  activity seen.  Fetal growth is appropriate for gestational age.  Umbilical artery Doppler showed normal forward diastolic flow.  I reassured the patient of normal fetal growth assessment.  I  encouraged her to check her blood glucose regularly and  bring them at her prenatal visit. ---------------------------------------------------------------------- Recommendations  -Appointments were made for weekly BPP from next week till  delivery.  -An appointment was made for her to return in 3 weeks for  fetal growth assessment. ----------------------------------------------------------------------                  Noralee Space, MD Electronically Signed Final Report   12/01/2020 04:27 pm ----------------------------------------------------------------------  [place] MFM UA CORD DOPPLER  Result Date:  12/01/2020 ----------------------------------------------------------------------  OBSTETRICS REPORT                       (Signed Final 12/01/2020 04:27 pm) ---------------------------------------------------------------------- Patient Info  ID #:       [id number]  D.O.B.:  01-Apr-1995 (25 yrs)  Name:       Susan Kaiser                 Visit Date: 12/01/2020 03:03 pm ---------------------------------------------------------------------- Performed By  Attending:        Tama High MD        Ref. Address:     [place]  Performed By:     Dorena Dew     Location[facility]         [facility]                    BS, RDMS                                 Fetal Care at                                                             [place] for                                                             Women  Referred By:      [facility] [place] ---------------------------------------------------------------------- Orders  #  Description                           Code        Ordered By  1  [place] MFM UA CORD DOPPLER                76820.02    RAVI SHANKAR  2  [place] MFM OB FOLLOW UP                   [id number]    RAVI [facility] ----------------------------------------------------------------------  #  Order #                     Accession #                Episode #  1  [id number]                   [id number]                 [id number]  2  [id number]                   [id number]                 [id number] ---------------------------------------------------------------------- Indications  Pre-existing diabetes, type 2, in pregnancy,   O24.113  third trimester  Hypertension - Chronic/Pre-existing  O10.019  [redacted] weeks gestation of pregnancy                Z3A.28  Maternal care for known or suspected poor      O36.5931  fetal growth, third trimester, fetus 1 IUGR  Pre-existing essential hypertension            123XX123  complicating  pregnancy, third trimester  Encounter for antenatal screening for          Z36.3  malformations  Obesity complicating pregnancy, second         O99.212  trimester (BMI 60)  Other mental disorder complicating             O99.340  pregnancy, second trimester  Maternal alcohol use complicating              O99.310 F10.99  pregnancy, antepartum (Early ETOH  exposure)  Genetic carrier (Sickle Cell Trait)            Z14.8  LR NIPS/ Negative AFP ---------------------------------------------------------------------- Fetal Evaluation  Num Of Fetuses:         1  Fetal Heart Rate(bpm):  158  Cardiac Activity:       Observed  Presentation:           Cephalic  Placenta:               Anterior  P. Cord Insertion:      Previously Visualized  Amniotic Fluid  AFI FV:      Within normal limits  AFI Sum(cm)     %Tile       Largest Pocket(cm)  16.86           62          5.47  RUQ(cm)       RLQ(cm)       LUQ(cm)        LLQ(cm)  5.47          4.89          2.61           3.89 ---------------------------------------------------------------------- Biometry  BPD:      76.8  mm     G. Age:  30w 6d         22  %    CI:        73.02   %    70 - 86                                                          FL/HC:      21.2   %    19.3 - 21.3  HC:      285.7  mm     G. Age:  31w 3d         14  %    HC/AC:      1.05        0.96 - 1.17  AC:      273.1  mm     G. Age:  31w 3d         46  %    FL/BPD:     79.0   %    71 - 87  FL:       60.7  mm     G. Age:  31w 4d         39  %    FL/AC:      22.2   %    20 - 24  HUM:      53.9  mm     G. Age:  31w 2d         50  %  LV:        2.4  mm  Est. FW:    1756  gm    3 lb 14 oz      36  % ---------------------------------------------------------------------- OB History  Blood Type:   AB+  Gravidity:    2         Term:   1        Prem:   0        SAB:   0  TOP:          0       Ectopic:  0        Living: 1 ---------------------------------------------------------------------- Gestational Age  LMP:            31w 3d        Date:  04/25/20                 EDD:   01/30/21  U/S Today:     31w 2d                                        EDD:   01/31/21  Best:          31w 3d     Det. By:  LMP  (04/25/20)          EDD:   01/30/21 ---------------------------------------------------------------------- Anatomy  Cranium:               Appears normal         LVOT:                   Previously seen  Cavum:                 Previously seen        Aortic Arch:            Previously seen  Ventricles:            Appears normal         Ductal Arch:            Previously seen  Choroid Plexus:        Previously seen        Diaphragm:              Appears normal  Cerebellum:            Previously seen        Stomach:                Appears normal, left                                                                        sided  Posterior Fossa:       Previously  seen        Abdomen:                Previously seen  Nuchal Fold:           Not applicable (Q000111Q    Abdominal Wall:         Previously seen                         wks GA)  Face:                  Orbits and profile     Cord Vessels:           Previously seen                         previously seen  Lips:                  Not well visualized    Kidneys:                Appear normal  Palate:                Not well visualized    Bladder:                Appears normal  Thoracic:              Previously seen        Spine:                  Not well visualized  Heart:                 Appears normal         Upper Extremities:      Previously seen                         (4CH, axis, and                         situs)  RVOT:                  Previously seen        Lower Extremities:      Previously seen  Other:  Female gender previously seen. Nasal bone, Heels and Open hands          previously visualized. Technically difficult due to maternal habitus and          fetal position. Lenses not well visualized. ---------------------------------------------------------------------- Doppler - Fetal  Vessels  Umbilical Artery   S/D     %tile      RI    %tile      PI    %tile            ADFV    RDFV   2.31       24    0.57       25    0.79       22               No      No ---------------------------------------------------------------------- Impression  Patient returned for fetal growth assessment.  On ultrasound  performed 4 weeks ago, the estimated fetal weight was at the  8 percentile.  Pre-existing diabetes.  Patient reports her blood glucose  levels are within normal range and diabetes  is diet controlled.  Today, her fasting level was increased at 106 mg/DL.  Chronic hypertension.  Well-controlled without  antihypertensives.  Blood pressure today at her office is  119/66 mmHg.  On today's ultrasound, amniotic fluid is normal good fetal  activity seen.  Fetal growth is appropriate for gestational age.  Umbilical artery Doppler showed normal forward diastolic flow.  I reassured the patient of normal fetal growth assessment.  I  encouraged her to check her blood glucose regularly and  bring them at her prenatal visit. ---------------------------------------------------------------------- Recommendations  -Appointments were made for weekly BPP from next week till  delivery.  -An appointment was made for her to return in 3 weeks for  fetal growth assessment. ----------------------------------------------------------------------                  Tama High, MD Electronically Signed Final Report   12/01/2020 04:27 pm ----------------------------------------------------------------------  [place] MFM UA CORD DOPPLER  Result Date: 11/17/2020 ----------------------------------------------------------------------  OBSTETRICS REPORT                       (Signed Final 11/17/2020 03:21 pm) ---------------------------------------------------------------------- Patient Info  ID #:       [id number]                          D.O.B.:  29-Nov-1995 (25 yrs)  Name:       Susan Kaiser                 Visit Date: 11/17/2020 01:51  pm ---------------------------------------------------------------------- Performed By  Attending:        Johnell Comings MD         Ref. Address:     [place]  Performed By:     Maryfrances Bunnell      Location[facility]         [facility]                                                             Fetal Care at                                                             [place] for                                                             Women  Referred By:      [facility] [place] ---------------------------------------------------------------------- Orders  #  Description  Code        Ordered By  1  [place] MFM UA CORD DOPPLER                [id number]    RAVI [facility] ----------------------------------------------------------------------  #  Order #                     Accession #                Episode #  1  [id number]                   [id number]                 [id number] ---------------------------------------------------------------------- Indications  Maternal care for known or suspected poor      O36.5931  fetal growth, third trimester, fetus 1 IUGR  Pre-existing diabetes, type 2, in pregnancy,   O24.113  third trimester  Pre-existing essential hypertension            123XX123  complicating pregnancy, third trimester  Hypertension - Chronic/Pre-existing            O10.019  [redacted] weeks gestation of pregnancy                Z3A.29  Encounter for antenatal screening for          Z36.3  malformations  Obesity complicating pregnancy, second         O99.212  trimester (BMI 60)  Other mental disorder complicating             O99.340  pregnancy, second trimester  Maternal alcohol use complicating              O99.310 F10.99  pregnancy, antepartum (Early ETOH  exposure)  Genetic carrier (Sickle Cell Trait)            Z14.8  LR NIPS/ Negative AFP ---------------------------------------------------------------------- Fetal Evaluation   Num Of Fetuses:         1  Fetal Heart Rate(bpm):  162  Cardiac Activity:       Observed  Presentation:           Cephalic  Placenta:               Anterior  P. Cord Insertion:      Previously Visualized  Amniotic Fluid  AFI FV:      Within normal limits  AFI Sum(cm)     %Tile       Largest Pocket(cm)  17.31           64          5.58  RUQ(cm)       RLQ(cm)       LUQ(cm)        LLQ(cm)  5.58          5.06          2.76           3.91 ---------------------------------------------------------------------- Biophysical Evaluation  Amniotic F.V:   Within normal limits       F. Tone:        Observed  F. Movement:    Observed                   Score:          8/8  F. Breathing:   Observed ---------------------------------------------------------------------- Biometry  LV:        8.8  mm ---------------------------------------------------------------------- OB History  Blood  Type:   AB+  Gravidity:    2         Term:   1        Prem:   0        SAB:   0  TOP:          0       Ectopic:  0        Living: 1 ---------------------------------------------------------------------- Gestational Age  LMP:           29w 3d        Date:  04/25/20                 EDD:   01/30/21  Best:          29w 3d     Det. By:  LMP  (04/25/20)          EDD:   01/30/21 ---------------------------------------------------------------------- Anatomy  Cranium:               Appears normal         LVOT:                   Previously seen  Cavum:                 Appears normal         Aortic Arch:            Previously seen  Ventricles:            Appears normal         Ductal Arch:            Previously seen  Choroid Plexus:        Previously seen        Diaphragm:              Previously seen  Cerebellum:            Previously seen        Stomach:                Appears normal, left                                                                        sided  Posterior Fossa:       Previously seen        Abdomen:                Previously seen  Nuchal  Fold:           Not applicable (Q000111Q    Abdominal Wall:         Previously seen                         wks GA)  Face:                  Orbits and profile     Cord Vessels:           Previously seen                         previously seen  Lips:  Not well visualized    Kidneys:                Appear normal  Palate:                Not well visualized    Bladder:                Appears normal  Thoracic:              Previously seen        Spine:                  Not well visualized  Heart:                 Previously seen        Upper Extremities:      Previously seen  RVOT:                  Previously seen        Lower Extremities:      Previously seen  Other:  Fetus appears to be female. Nasal bone, Heels and Open hands          previously visualized. Technically difficult due to maternal habitus and          fetal position. Lenses not well visualized. ---------------------------------------------------------------------- Doppler - Fetal Vessels  Umbilical Artery   S/D     %tile      RI    %tile      PI    %tile            ADFV    RDFV   2.17       10    0.54        8    0.75        9               No      No ---------------------------------------------------------------------- Cervix Uterus Adnexa  Cervix  Not visualized (advanced GA >24wks)  Uterus  No abnormality visualized.  Right Ovary  Not visualized.  Left Ovary  Not visualized. ---------------------------------------------------------------------- Comments  This patient was seen due to an IUGR fetus.  Her pregnancy  has also been complicated by pregestational diabetes,  chronic hypertension, and maternal obesity with a BMI of 60.  She denies any problems since her last exam.  She reports  feeling vigorous fetal movements throughout the day.  A biophysical profile performed today was 8 out of 8.  There was normal amniotic fluid noted on today's ultrasound  exam.  Doppler studies of the umbilical arteries performed due to  fetal growth  restriction showed a normal S/D ratio of 2.17.  There were no signs of absent or reversed end-diastolic flow  noted today.  She will return in 1 week for another growth ultrasound and  umbilical artery Doppler study. ----------------------------------------------------------------------                   Johnell Comings, MD Electronically Signed Final Report   11/17/2020 03:21 pm ----------------------------------------------------------------------   Assessment and Plan:  Pregnancy: G2P1001 at [redacted]w[redacted]d 1. Supervision of other normal pregnancy, antepartum Good fetal movement Occasional contractions  2. Pre-existing diabetes mellitus affecting pregnancy, antepartum Had 3 elevated fasting blood sugars over 2 weeks. PP CBGs have been normal.  Last growth scan: 10/26 - 36%  3. BMI 60.0-69.9, adult ([place])   4. 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 I discussed the assessment and treatment plan with the patient. The patient was provided an opportunity to ask questions and all were answered. The patient agreed with the plan and demonstrated an understanding of the instructions. The patient was advised to call back or seek an in-person office evaluation/go to MAU at [facility] for any urgent or concerning symptoms. Please refer to After Visit Summary for other counseling recommendations.   I provided 10 minutes of face-to-face time during this encounter.  No follow-ups on file.  Future Appointments  Date Time Provider [place]  12/09/2020  3:15 PM [facility] NST [facility]  12/16/2020  2:15 PM [facility]  12/22/2020  2:00 PM [facility] NURSE [facility]  12/22/2020  2:15 PM [facility] US2 [facility]  12/23/2020  1:10 PM Truett Mainland, DO [facility] None   12/29/2020  3:15 PM [facility] NST [facility]  01/05/2021  3:15 PM [facility] NST [facility]  01/06/2021  1:10 PM Truett Mainland, DO [facility] None  01/12/2021  2:45 PM [facility] NURSE [facility]  01/12/2021  3:00 PM [facility] US1 [facility] [place] [facility] [employer] [place]

## 2020-12-15 NOTE — Progress Notes (Signed)
Chart reviewed - agree with CMA/RN documentation.  ° °

## 2020-12-16 ENCOUNTER — Other Ambulatory Visit: Payer: Self-pay

## 2020-12-16 ENCOUNTER — Ambulatory Visit (INDEPENDENT_AMBULATORY_CARE_PROVIDER_SITE_OTHER): Payer: Managed Care, Other (non HMO) | Admitting: General Practice

## 2020-12-16 ENCOUNTER — Ambulatory Visit (INDEPENDENT_AMBULATORY_CARE_PROVIDER_SITE_OTHER): Payer: Managed Care, Other (non HMO)

## 2020-12-16 VITALS — BP 128/80 | HR 102

## 2020-12-16 DIAGNOSIS — O24319 Unspecified pre-existing diabetes mellitus in pregnancy, unspecified trimester: Secondary | ICD-10-CM

## 2020-12-16 NOTE — Progress Notes (Signed)
Patient seen and assessed by nursing staff.  Agree with documentation and plan.  NST:  Baseline: 155 bpm, Variability: Good {> 6 bpm), Accelerations: Reactive, and Decelerations: Absent

## 2020-12-16 NOTE — Progress Notes (Signed)
Pt informed that the ultrasound is considered a limited OB ultrasound and is not intended to be a complete ultrasound exam.  Patient also informed that the ultrasound is not being completed with the intent of assessing for fetal or placental anomalies or any pelvic abnormalities.  Explained that the purpose of today's ultrasound is to assess for  BPP, presentation, and AFI.  Patient acknowledges the purpose of the exam and the limitations of the study.     Chase Caller RN BSN 12/16/20

## 2020-12-16 NOTE — Progress Notes (Signed)
Patient reports seeing some pink spotting this morning once with wiping but none since then.

## 2020-12-21 ENCOUNTER — Other Ambulatory Visit: Payer: Self-pay

## 2020-12-21 DIAGNOSIS — O99340 Other mental disorders complicating pregnancy, unspecified trimester: Secondary | ICD-10-CM

## 2020-12-21 DIAGNOSIS — F32A Depression, unspecified: Secondary | ICD-10-CM

## 2020-12-22 ENCOUNTER — Ambulatory Visit: Payer: Managed Care, Other (non HMO) | Admitting: *Deleted

## 2020-12-22 ENCOUNTER — Encounter: Payer: Self-pay | Admitting: *Deleted

## 2020-12-22 ENCOUNTER — Ambulatory Visit: Payer: Managed Care, Other (non HMO) | Attending: Obstetrics and Gynecology

## 2020-12-22 ENCOUNTER — Other Ambulatory Visit: Payer: Self-pay

## 2020-12-22 VITALS — BP 132/80 | HR 101

## 2020-12-22 DIAGNOSIS — O99213 Obesity complicating pregnancy, third trimester: Secondary | ICD-10-CM | POA: Diagnosis not present

## 2020-12-22 DIAGNOSIS — O10019 Pre-existing essential hypertension complicating pregnancy, unspecified trimester: Secondary | ICD-10-CM | POA: Diagnosis not present

## 2020-12-22 DIAGNOSIS — E119 Type 2 diabetes mellitus without complications: Secondary | ICD-10-CM

## 2020-12-22 DIAGNOSIS — Z363 Encounter for antenatal screening for malformations: Secondary | ICD-10-CM | POA: Insufficient documentation

## 2020-12-22 DIAGNOSIS — Z148 Genetic carrier of other disease: Secondary | ICD-10-CM | POA: Diagnosis not present

## 2020-12-22 DIAGNOSIS — D573 Sickle-cell trait: Secondary | ICD-10-CM

## 2020-12-22 DIAGNOSIS — O365930 Maternal care for other known or suspected poor fetal growth, third trimester, not applicable or unspecified: Secondary | ICD-10-CM | POA: Diagnosis not present

## 2020-12-22 DIAGNOSIS — O24319 Unspecified pre-existing diabetes mellitus in pregnancy, unspecified trimester: Secondary | ICD-10-CM | POA: Diagnosis present

## 2020-12-22 DIAGNOSIS — Z6841 Body Mass Index (BMI) 40.0 and over, adult: Secondary | ICD-10-CM

## 2020-12-22 DIAGNOSIS — O24113 Pre-existing diabetes mellitus, type 2, in pregnancy, third trimester: Secondary | ICD-10-CM

## 2020-12-22 DIAGNOSIS — E669 Obesity, unspecified: Secondary | ICD-10-CM

## 2020-12-22 DIAGNOSIS — Z3689 Encounter for other specified antenatal screening: Secondary | ICD-10-CM

## 2020-12-22 DIAGNOSIS — O10013 Pre-existing essential hypertension complicating pregnancy, third trimester: Secondary | ICD-10-CM | POA: Diagnosis not present

## 2020-12-22 DIAGNOSIS — O99313 Alcohol use complicating pregnancy, third trimester: Secondary | ICD-10-CM | POA: Diagnosis not present

## 2020-12-22 DIAGNOSIS — Z3480 Encounter for supervision of other normal pregnancy, unspecified trimester: Secondary | ICD-10-CM

## 2020-12-22 DIAGNOSIS — Z3A34 34 weeks gestation of pregnancy: Secondary | ICD-10-CM | POA: Insufficient documentation

## 2020-12-22 DIAGNOSIS — O9902 Anemia complicating childbirth: Secondary | ICD-10-CM | POA: Insufficient documentation

## 2020-12-23 ENCOUNTER — Ambulatory Visit (INDEPENDENT_AMBULATORY_CARE_PROVIDER_SITE_OTHER): Payer: Managed Care, Other (non HMO) | Admitting: Family Medicine

## 2020-12-23 VITALS — BP 126/76 | HR 102 | Wt >= 6400 oz

## 2020-12-23 DIAGNOSIS — D573 Sickle-cell trait: Secondary | ICD-10-CM

## 2020-12-23 DIAGNOSIS — Z6841 Body Mass Index (BMI) 40.0 and over, adult: Secondary | ICD-10-CM

## 2020-12-23 DIAGNOSIS — F411 Generalized anxiety disorder: Secondary | ICD-10-CM

## 2020-12-23 DIAGNOSIS — O9902 Anemia complicating childbirth: Secondary | ICD-10-CM

## 2020-12-23 DIAGNOSIS — Z3480 Encounter for supervision of other normal pregnancy, unspecified trimester: Secondary | ICD-10-CM

## 2020-12-23 DIAGNOSIS — O10019 Pre-existing essential hypertension complicating pregnancy, unspecified trimester: Secondary | ICD-10-CM

## 2020-12-23 DIAGNOSIS — E119 Type 2 diabetes mellitus without complications: Secondary | ICD-10-CM

## 2020-12-23 DIAGNOSIS — Z3A34 34 weeks gestation of pregnancy: Secondary | ICD-10-CM

## 2020-12-23 NOTE — Progress Notes (Signed)
   PRENATAL VISIT NOTE  Subjective:  Susan Kaiser is a 25 y.o. G2P1001 at [redacted]w[redacted]d being seen today for ongoing prenatal care.  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  Patient reports no complaints.  Contractions: Irritability. Vag. Bleeding: None.  Movement: Present. Denies leaking of fluid.   The following portions of the patient's history were reviewed and updated as appropriate: allergies, current medications, past family history, past medical history, past social history, past surgical history and problem list.   Objective:   Vitals:   12/23/20 1317  BP: 126/76  Pulse: (!) 102  Weight: (!) 405 lb (183.7 kg)    Fetal Status:     Movement: Present     General:  Alert, oriented and cooperative. Patient is in no acute distress.  Skin: Skin is warm and dry. No rash noted.   Cardiovascular: Normal heart rate noted  Respiratory: Normal respiratory effort, no problems with respiration noted  Abdomen: Soft, gravid, appropriate for gestational age.  Pain/Pressure: Absent     Pelvic: Cervical exam deferred        Extremities: Normal range of motion.  Edema: Trace  Mental Status: Normal mood and affect. Normal behavior. Normal judgment and thought content.   Assessment and Plan:  Pregnancy: G2P1001 at [redacted]w[redacted]d 1. Supervision of other normal pregnancy, antepartum FHT normal Still uncertain about  contraception.   2. 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 Chronic benign essential hypertension, antepartum BP normal  4. Type 2 diabetes mellitus without complication, without long-term current use of insulin (HCC) Fetal echo normal EFW 37% yesterday Reports CBGs as controlled  5. BMI 60.0-69.9, adult (HCC)  6. GAD (generalized anxiety disorder)  Preterm labor symptoms and general obstetric precautions including but not limited to vaginal bleeding, contractions, leaking of fluid and fetal movement were reviewed in detail with the patient. Please refer to After Visit Summary for other counseling recommendations.   No follow-ups on file.  Future Appointments  Date Time Provider Department Center  12/29/2020  3:15 PM [facility] NST [facility]  01/05/2021  3:15 PM [facility] NST [facility]  01/06/2021  1:10 PM Levie Heritage, DO [facility] None  01/12/2021  2:45 PM [facility] NURSE [facility]  01/12/2021  3:00 PM [facility] US1 [facility]    Levie Heritage, DO

## 2020-12-28 NOTE — BH Specialist Note (Signed)
Integrated Behavioral Health via Telemedicine Visit  12/28/2020 Susan Kaiser [id number]  Number of Integrated Behavioral Health visits: 1 Session Start time: 2:55  Session End time: 3:56 Total time:  33  Referring Provider: Wynelle Bourgeois, CNM Patient/Family location: Home [facility] Provider [facility]  All persons participating in visit: Patient Susan Kaiser and [facility] [name]   Types of Service: Individual psychotherapy and Video visit  I connected with Susan Kaiser and/or Susan Kaiser  n/a  via  Telephone or Video Enabled Telemedicine Application  (Video is Caregility application) and verified that I am speaking with the correct person using two identifiers. Discussed confidentiality: Yes   I discussed the limitations of telemedicine and the availability of in person appointments.  Discussed there is a possibility of technology failure and discussed alternative modes of communication if that failure occurs.  I discussed that engaging in this telemedicine visit, they consent to the provision of behavioral healthcare and the services will be billed under their insurance.  Patient and/or legal guardian expressed understanding and consented to Telemedicine visit: Yes   Presenting Concerns: Patient and/or family reports the following symptoms/concerns: Life stress due to housing and family conflict; increase in depression and anxiety; pt's goal is to get into permanent housing for peace of mind.  Duration of problem: Increase in current pregnancy; ongoing; Severity of problem: severe  Patient and/or Family's Strengths/Protective Factors: Sense of purpose  Goals Addressed: Patient will:  Reduce symptoms of: anxiety, depression, and stress   Increase knowledge and/or ability of: coping skills and healthy habits   Demonstrate ability to: Increase healthy adjustment to current life circumstances and Increase  adequate support systems for patient/family  Progress towards Goals: Ongoing  Interventions: Interventions utilized:  Mindfulness or Management consultant, Medication Monitoring, Psychoeducation and/or Health Education, and Link to [employer] Standardized Assessments completed: GAD-7 and PHQ 9  Patient and/or Family Response: Pt agrees with treatment plan  Assessment: Patient currently experiencing Major depressive disorder, recurrent, severe, without psychotic features, Generalized anxiety disorder and Psychosocial stress.   Patient may benefit from psychoeducation and brief therapeutic interventions regarding coping with symptoms of depression, anxiety, life stress .  Plan: Follow up with behavioral health clinician on : Two weeks Behavioral recommendations:  -Continue taking Zoloft as prescribed; you will be contacted with any changes to medication -CALM relaxation breathing exercise twice daily (morning; at bedtime with sleep sounds) -Accept referral to [facility] -Consider additional resources on After Visit Summary; use as needed Referral(s): Integrated Art gallery manager (In Clinic) and [employer]:  Food, Housing, and new mom support/resources  I discussed the assessment and treatment plan with the patient and/or parent/guardian. They were provided an opportunity to ask questions and all were answered. They agreed with the plan and demonstrated an understanding of the instructions.   They were advised to call back or seek an in-person evaluation if the symptoms worsen or if the condition fails to improve as anticipated.  Rae Lips, LCSW  Depression screen [facility] 2/9 01/11/2021 10/21/2020 07/09/2020  Decreased Interest 3 2 3   Down, Depressed, Hopeless 3 2 2   PHQ - 2 Score 6 4 5   Altered sleeping 3 3 3   Tired, decreased energy 3 2 3   Change in appetite 3 2 3   Feeling bad or failure about yourself  3 2 2   Trouble concentrating 1 2 3   Moving  slowly or fidgety/restless 0 0 1  Suicidal thoughts 0  0 0  PHQ-9 Score 19 15 20   Difficult doing work/chores - - Somewhat difficult   GAD 7 : Generalized Anxiety Score 01/11/2021 10/21/2020 07/09/2020  Nervous, Anxious, on Edge 3 3 3   Control/stop worrying 3 2 3   Worry too much - different things 3 2 3   Trouble relaxing 3 2 3   Restless 0 0 1  Easily annoyed or irritable 3 2 3   Afraid - awful might happen 2 1 2   Total GAD 7 Score 17 12 18

## 2020-12-29 ENCOUNTER — Other Ambulatory Visit: Payer: Self-pay

## 2020-12-29 ENCOUNTER — Ambulatory Visit (INDEPENDENT_AMBULATORY_CARE_PROVIDER_SITE_OTHER): Payer: Managed Care, Other (non HMO) | Admitting: General Practice

## 2020-12-29 ENCOUNTER — Ambulatory Visit (INDEPENDENT_AMBULATORY_CARE_PROVIDER_SITE_OTHER): Payer: Managed Care, Other (non HMO)

## 2020-12-29 VITALS — BP 132/80 | HR 99

## 2020-12-29 DIAGNOSIS — O24319 Unspecified pre-existing diabetes mellitus in pregnancy, unspecified trimester: Secondary | ICD-10-CM

## 2020-12-29 NOTE — Progress Notes (Signed)
Pt informed that the ultrasound is considered a limited OB ultrasound and is not intended to be a complete ultrasound exam.  Patient also informed that the ultrasound is not being completed with the intent of assessing for fetal or placental anomalies or any pelvic abnormalities.  Explained that the purpose of today's ultrasound is to assess for  BPP, presentation, and AFI.  Patient acknowledges the purpose of the exam and the limitations of the study.     Chase Caller RN BSN 12/29/20

## 2020-12-29 NOTE — Progress Notes (Signed)
Chart reviewed - agree with CMA/RN documentation.  ° °

## 2021-01-05 ENCOUNTER — Ambulatory Visit: Payer: Self-pay

## 2021-01-05 ENCOUNTER — Ambulatory Visit (INDEPENDENT_AMBULATORY_CARE_PROVIDER_SITE_OTHER): Payer: Managed Care, Other (non HMO) | Admitting: General Practice

## 2021-01-05 ENCOUNTER — Other Ambulatory Visit: Payer: Self-pay

## 2021-01-05 VITALS — BP 139/75 | HR 102

## 2021-01-05 DIAGNOSIS — O24319 Unspecified pre-existing diabetes mellitus in pregnancy, unspecified trimester: Secondary | ICD-10-CM

## 2021-01-05 DIAGNOSIS — O163 Unspecified maternal hypertension, third trimester: Secondary | ICD-10-CM

## 2021-01-05 NOTE — Progress Notes (Signed)
Pt informed that the ultrasound is considered a limited OB ultrasound and is not intended to be a complete ultrasound exam.  Patient also informed that the ultrasound is not being completed with the intent of assessing for fetal or placental anomalies or any pelvic abnormalities.  Explained that the purpose of today's ultrasound is to assess for  BPP, presentation, and AFI.  Patient acknowledges the purpose of the exam and the limitations of the study.     Chase Caller RN BSN 01/05/21

## 2021-01-06 ENCOUNTER — Other Ambulatory Visit (HOSPITAL_COMMUNITY)
Admission: RE | Admit: 2021-01-06 | Discharge: 2021-01-06 | Disposition: A | Discharge status: Home / Self Care | Payer: Managed Care, Other (non HMO) | Source: Ambulatory Visit | Attending: Family Medicine | Admitting: Family Medicine

## 2021-01-06 ENCOUNTER — Ambulatory Visit (INDEPENDENT_AMBULATORY_CARE_PROVIDER_SITE_OTHER): Payer: Managed Care, Other (non HMO) | Admitting: Family Medicine

## 2021-01-06 VITALS — BP 110/65 | HR 103 | Wt >= 6400 oz

## 2021-01-06 DIAGNOSIS — E119 Type 2 diabetes mellitus without complications: Secondary | ICD-10-CM

## 2021-01-06 DIAGNOSIS — Z3A36 36 weeks gestation of pregnancy: Secondary | ICD-10-CM

## 2021-01-06 DIAGNOSIS — Z6841 Body Mass Index (BMI) 40.0 and over, adult: Secondary | ICD-10-CM

## 2021-01-06 DIAGNOSIS — D573 Sickle-cell trait: Secondary | ICD-10-CM

## 2021-01-06 DIAGNOSIS — Z3480 Encounter for supervision of other normal pregnancy, unspecified trimester: Secondary | ICD-10-CM

## 2021-01-06 DIAGNOSIS — O10019 Pre-existing essential hypertension complicating pregnancy, unspecified trimester: Secondary | ICD-10-CM

## 2021-01-06 DIAGNOSIS — O9902 Anemia complicating childbirth: Secondary | ICD-10-CM

## 2021-01-06 NOTE — Progress Notes (Signed)
   PRENATAL VISIT NOTE  Subjective:  Susan Kaiser is a 25 y.o. G2P1001 at [redacted]w[redacted]d being seen today for ongoing prenatal care.  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  Patient reports no complaints.  Contractions: Irritability. Vag. Bleeding: None.  Movement: Present. Denies leaking of fluid.   The following portions of the patient's history were reviewed and updated as appropriate: allergies, current medications, past family history, past medical history, past social history, past surgical history and problem list.   Objective:   Vitals:   01/06/21 1322  BP: 110/65  Pulse: (!) 103  Weight: (!) 404 lb (183.3 kg)    Fetal Status: Fetal Heart Rate (bpm): 155   Movement: Present     General:  Alert, oriented and cooperative. Patient is in no acute distress.  Skin: Skin is warm and dry. No rash noted.   Cardiovascular: Normal heart rate noted  Respiratory: Normal respiratory effort, no problems with respiration noted  Abdomen: Soft, gravid, appropriate for gestational age.  Pain/Pressure: Present     Pelvic: Cervical exam deferred        Extremities: Normal range of motion.  Edema: None  Mental Status: Normal mood and affect. Normal behavior. Normal judgment and thought content.   Assessment and Plan:  Pregnancy: G2P1001 at [redacted]w[redacted]d 1. [redacted] weeks gestation of pregnancy FHT and FH normal  2.  Supervision of other normal pregnancy, antepartum  3. Type 2 diabetes mellitus without complication, without long-term current use of insulin (HCC) Diet controlled.  CBGs controlled BPP yesterday. Has growth [place] next week  4. Chronic benign essential hypertension, antepartum BP controlled off medications. Induce at 39 weeks  5. BMI 60.0-69.9, adult (HCC)  6. 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 Please refer to After Visit Summary for other counseling recommendations.   No follow-ups on file.  Future Appointments  Date Time Provider Department Center  01/11/2021  2:45 PM [facility] HEALTH CLINICIAN [facility]  01/12/2021  2:45 PM [facility] NURSE [facility]  01/12/2021  3:00 PM [facility] US1 [facility]  01/13/2021  1:30 PM Constant, Peggy, MD [facility] None  01/19/2021  9:35 AM Levie Heritage, DO [facility] None  01/27/2021  1:50 PM Levie Heritage, DO [facility] None    Levie Heritage, DO

## 2021-01-10 LAB — GC/CHLAMYDIA PROBE AMP (~~LOC~~) NOT AT ARMC
Chlamydia: NEGATIVE
Comment: NEGATIVE
Comment: NORMAL
Neisseria Gonorrhea: NEGATIVE

## 2021-01-10 LAB — CULTURE, BETA STREP (GROUP B ONLY): Strep Gp B Culture: NEGATIVE

## 2021-01-11 ENCOUNTER — Ambulatory Visit (INDEPENDENT_AMBULATORY_CARE_PROVIDER_SITE_OTHER): Payer: 59 | Admitting: Clinical

## 2021-01-11 DIAGNOSIS — F411 Generalized anxiety disorder: Secondary | ICD-10-CM | POA: Diagnosis not present

## 2021-01-11 DIAGNOSIS — F332 Major depressive disorder, recurrent severe without psychotic features: Secondary | ICD-10-CM

## 2021-01-11 DIAGNOSIS — Z658 Other specified problems related to psychosocial circumstances: Secondary | ICD-10-CM | POA: Diagnosis not present

## 2021-01-11 NOTE — Patient Instructions (Addendum)
Center for [facility] at [facility] [place], [place] [address] [phone] (main office) [phone] ([place] office)  New mom online support groups:  www.postpartum.net  www.conehealthybaby.com Administrator for classes, view virtual tour of hospital, etc.)  24/7 [place]: [phone] Access to [place] (serves [place], [place], [place], [place], [place], [place], [place], [place], [place], [place], [place], [place], and [place] counties) [address], [place], [place] [address] [phone] [email or web address]  **Rental assistance, Home Rehabilitation,Weatherization Assistance Program, Forensic psychologist, Housing Voucher Program  Eviction Mediation Program: The HOPE Program Https://[email or web address].http://mills-[email or web address]/ HOPE Progam serves low-income renters in [place] counties, defined as less than or equal to 80% of the area median income for the county where the renter lives. In the following 12 counties, you should apply to your local rent and utility assistance program INSTEAD OF the HOPE Program: [place], [place], [place], [place], [place], [place], [place], [place], [place], [place], [place]  If you live outside of [place], contact [place] call center at [phone] to talk to a Program Representative Monday-Friday, 8am-5pm Note that Native American tribes also received federal funding for rent and utility assistance programs. Recognized members of the following tribes will be served by programs managed by tribal governments, including: [place], [place], [place], [place] of [place] and [place], [place], [phone] [email or web address] .[place], [phone] [email or web address]    [place] and Websites Here are a few free apps meant to help you to help yourself.  To find, try searching on the internet to see if the app is offered on Apple/Android devices. If your first choice doesn't come up on your device, the good news is that there are many choices! Play around with different apps to see which ones are helpful to you.    Calm This is an app meant to help increase calm feelings. Includes info, strategies, and tools for tracking your feelings.      Calm Harm  This app is meant to help with self-harm. Provides many 5-minute or 15-min coping strategies for doing instead of hurting yourself.       [place] is a problem-solving tool to help deal with emotions and cope with stress you encounter wherever you are.      MindShift This app can help people cope with anxiety. Rather than trying to avoid anxiety, you can make an important shift and face it.      MY3  MY3 features a support system, safety plan and resources with the goal of offering a tool to use in a time of need.       My Life My Voice  This mood journal offers a simple solution for tracking your thoughts, feelings and moods. Animated emoticons can help identify your mood.       Relax Melodies Designed to help with sleep, on this app you can mix sounds and meditations for relaxation.      Smiling Mind Smiling Mind is meditation made easy: it's a simple tool that helps put a smile on your mind.        Stop, Breathe & Think  A friendly, simple guide for people through  meditations for mindfulness and compassion.  Stop, Breathe and Think Kids Enter your current feelings and choose a "mission" to help you cope. Offers videos for certain moods instead of just sound recordings.       Team Orange The goal of this tool is to help teens change how  they think, act, and react. This app helps you focus on your own good feelings and experiences.      The [place] Box The [place] Box (VHB) contains simple tools to help patients with coping, relaxation, distraction, and positive thinking.        BRAINSTORMING  Develop a Plan Goals: Provide a way to start conversation about your new life with a baby Assist parents in recognizing and using resources within their reach Help pave the way before birth for an easier period of transition afterwards.  Make a list of the following information to keep in a central location: Full name of Mom and Partner: _____________________________________________ 19 full name and Date of Birth: ___________________________________________ Home Address: ___________________________________________________________ ________________________________________________________________________ Home Phone: ____________________________________________________________ Parents' cell numbers: _____________________________________________________ ________________________________________________________________________ Name and contact info for OB: ______________________________________________ Name and contact info for Pediatrician:________________________________________ Contact info for Lactation Consultants: ________________________________________  REST and SLEEP *You each need at least 4-5 hours of uninterrupted sleep every day. Write specific names and contact information.* How are you going to rest in the postpartum period? While partner's home? When partner returns to work? When you both return to work? Where will your baby sleep? Who is available to help during the day? Evening? Night? Who could move in for a period to help support you? What are some ideas to help you get enough  sleep? __________________________________________________________________________________________________________________________________________________________________________________________________________________________________________ NUTRITIOUS FOOD AND DRINK *Plan for meals before your baby is born so you can have healthy food to eat during the immediate postpartum period.* Who will look after breakfast? Lunch? Dinner? List names and contact information. Brainstorm quick, healthy ideas for each meal. What can you do before baby is born to prepare meals for the postpartum period? How can others help you with meals? Which grocery stores provide online shopping and delivery? Which restaurants offer take-out or delivery options? ______________________________________________________________________________________________________________________________________________________________________________________________________________________________________________________________________________________________________________________________________________________________________________________________________  CARE FOR MOM *It's important that mom is cared for and pampered in the postpartum period. Remember, the most important ways new mothers need care are: sleep, nutrition, gentle exercise, and time off.* Who can come take care of mom during this period? Make a list of people with their contact information. List some activities that make you feel cared for, rested, and energized? Who can make sure you have opportunities to do these things? Does mom have a space of her very own within your home that's just for her? Make a "[facility]" where she can be comfortable, rest, and renew herself  daily. ______________________________________________________________________________________________________________________________________________________________________________________________________________________________________________________________________________________________________________________________________________________________________________________________________    CARE FOR AND FEEDING BABY *Knowledgeable and encouraging people will offer the best support with regard to feeding your baby.* Educate yourself and choose the best feeding option for your baby. Make a list of people who will guide, support, and be a resource for you as your care for and feed your baby. (Friends that have breastfed or are currently breastfeeding, lactation consultants, breastfeeding support groups, etc.) Consider a postpartum doula. (These websites can give you information: dona.org & BuyingShow.es) Seek out local breastfeeding resources like the breastfeeding support group at [employer] or [employer]. ______________________________________________________________________________________________________________________________________________________________________________________________________________________________________________________________________________________________________________________________________________________________________________________________________  Verner Chol AND ERRANDS Who can help with a thorough cleaning before baby is born? Make a list of  people who will help with housekeeping and chores, like laundry, light cleaning, dishes, bathrooms, etc. Who can run some errands for you? What can you do to make sure you are stocked with basic supplies before baby is born? Who is going to do the  shopping? ______________________________________________________________________________________________________________________________________________________________________________________________________________________________________________________________________________________________________________________________________________________________________________________________________     Family Adjustment *Nurture yourselves.it helps parents be more loving and allows for better bonding with their child.* What sorts of things do you and partner enjoy doing together? Which activities help you to connect and strengthen your relationship? Make a list of those things. Make a list of people whom you trust to care for your baby so you can have some time together as a couple. What types of things help partner feel connected to Mom? Make a list. What needs will partner have in order to bond with baby? Other children? Who will care for them when you go into labor and while you are in the hospital? Think about what the needs of your older children might be. Who can help you meet those needs? In what ways are you helping them prepare for bringing baby home? List some specific strategies you have for family adjustment. _______________________________________________________________________________________________________________________________________________________________________________________________________________________________________________________________________________________________________________________________________________  SUPPORT *Someone who can empathize with experiences normalizes your problems and makes them more bearable.* Make a list of other friends, neighbors, and/or co-workers you know with infants (and small children, if applicable) with whom you can connect. Make a list of local or online support groups, mom groups, etc. in which you can be  involved. ______________________________________________________________________________________________________________________________________________________________________________________________________________________________________________________________________________________________________________________________________________________________________________________________________  Childcare Plans Investigate and plan for childcare if mom is returning to work. Talk about mom's concerns about her transition back to work. Talk about partner's concerns regarding this transition.  Mental Health *Your mental health is one of the highest priorities for a pregnant or postpartum mom.* 1 in 5 women experience anxiety and/or depression from the time of conception through the first year after birth. Postpartum Mood Disorders are the #1 complication of pregnancy and childbirth and the suffering experienced by these mothers is not necessary! These illnesses are temporary and respond well to treatment, which often includes self-care, social support, talk therapy, and medication when needed. Women experiencing anxiety and depression often say things like: "I'm supposed to be happy.why do I feel so sad?", "Why can't I snap out of it?", "I'm having thoughts that scare me." There is no need to be embarrassed if you are feeling these symptoms: Overwhelmed, anxious, angry, sad, guilty, irritable, hopeless, exhausted but can't sleep You are NOT alone. You are NOT to blame. With help, you WILL be well. Where can I find help? Medical professionals such as your OB, midwife, gynecologist, family practitioner, primary care provider, pediatrician, or mental health providers; [facility] support groups: Feelings After Birth, Breastfeeding Support Group, Baby and Me Group, and Fit 4 Two exercise classes. You have permission to ask for help. It will confirm your feelings, validate your experiences,  share/learn coping strategies, and gain support and encouragement as you heal. You are important! BRAINSTORM Make a list of local resources, including resources for mom and for partner. Identify support groups. Identify people to call late at night - include names and contact info. Talk with partner about perinatal mood and anxiety disorders. Talk with your OB, midwife, and doula about baby blues and about perinatal mood and anxiety disorders. Talk with your pediatrician about perinatal mood and anxiety disorders.   Support & Sanity Savers   What do you really need?  Basics In preparing for a  new baby, many expectant parents spend hours shopping for baby clothes, decorating the nursery, and deciding which car seat to buy. Yet most don't think much about what the reality of parenting a newborn will be like, and what they need to make it through that. So, here is the advice of experienced parents. We know you'll read this, and think "they're exaggerating, I don't really need that." Just trust [place] on these, OK? Plan for all of this, and if it turns out you don't need it, come back and teach [place] how you did it!  Must-Haves (Once baby's survival needs are met, make sure you attend to your own survival needs!) Sleep An average newborn sleeps 16-18 hours per day, over 6-7 sleep periods, rarely more than three hours at a time. It is normal and healthy for a newborn to wake throughout the night... but really hard on parents!! Naps. Prioritize sleep above any responsibilities like: cleaning house, visiting friends, running errands, etc.  Sleep whenever baby sleeps. If you can't nap, at least have restful times when baby eats. The more rest you get, the more patient you will be, the more emotionally stable, and better at solving problems.  Food You may not have realized it would be difficult to eat when you have a newborn. Yet, when we talk to countless new parents, they say things like "it may be 2:00 pm  when I realize I haven't had breakfast yet." Or "every time we sit down to dinner, baby needs to eat, and my food gets cold, so I don't bother to eat it." Finger food. Before your baby is born, stock up with one months' worth of food that: 1) you can eat with one hand while holding a baby, 2) doesn't need to be prepped, 3) is good hot or cold, 4) doesn't spoil when left out for a few hours, and 5) you like to eat. Think about: nuts, dried fruit, Clif bars, pretzels, jerky, gogurt, baby carrots, apples, bananas, crackers, cheez-n-crackers, string cheese, hot pockets or frozen burritos to microwave, garden burgers and breakfast pastries to put in the toaster, yogurt drinks, etc. Restaurant Menus. Make lists of your favorite restaurants & menu items. When family/friends want to help, you can give specific information without much thought. They can either bring you the food or send gift cards for just the right meals. Freezer Meals.  Take some time to make a few meals to put in the freezer ahead of time.  Easy to freeze meals can be anything such as soup, lasagna, chicken pie, or spaghetti sauce. Set up a Meal Schedule.  Ask friends and family to sign up to bring you meals during the first few weeks of being home. (It can be passed around at baby showers!) You have no idea how helpful this will be until you are in the throes of parenting.  https://hamilton-[email or web address]/ is a great website to check out. Emotional Support Know who to call when you're stressed out. Parenting a newborn is very challenging work. There are times when it totally overwhelms your normal coping abilities. EVERY NEW PARENT NEEDS TO HAVE A PLAN FOR WHO TO CALL WHEN THEY JUST CAN'T COPE ANY MORE. (And it has to be someone other than the baby's other parent!) Before your baby is born, come up with at least one person you can call for support - write their phone number down and post it on the refrigerator. Anxiety & Sadness. Baby blues are normal after  pregnancy; however, there  are more severe types of anxiety & sadness which can occur and should not be ignored.  They are always treatable, but you have to take the first step by reaching out for help. [facility] offers a "Mom Talk" group which meets every Tuesday from 10 am - 11 am.  This group is for new moms who need support and connection after their babies are born.  Call [phone].  Really, Really Helpful (Plan for them! Make sure these happen often!!) Physical Support with Taking Care of Yourselves Asking friends and family. Before your baby is born, set up a schedule of people who can come and visit and help out (or ask a friend to schedule for you). Any time someone says "let me know what I can do to help," sign them up for a day. When they get there, their job is not to take care of the baby (that's your job and your joy). Their job is to take care of you!  Postpartum doulas. If you don't have anyone you can call on for support, look into postpartum doulas:  professionals at helping parents with caring for baby, caring for themselves, getting breastfeeding started, and helping with household tasks. www.padanc.org is a helpful website for learning about doulas in our area. Peer Support / Parent Groups Why: One of the greatest ideas for new parents is to be around other new parents. Parent groups give you a chance to share and listen to others who are going through the same season of life, get a sense of what is normal infant development by watching several babies learn and grow, share your stories of triumph and struggles with empathetic ears, and forgive your own mistakes when you realize all parents are learning by trial and error. Where to find: There are many places you can meet other new parents throughout our community.  [facility] offers the following classes for new moms and their little ones:  Baby and Me (Birth to [place]) and Breastfeeding Support Group. Go to  www.conehealthybaby.com or call [phone] for more information. Time for your Relationship It's easy to get so caught up in meeting baby's immediate needs that it's hard to find time to connect with your partner, and meet the needs of your relationship. It's also easy to forget what "quality time with your partner" actually looks like. If you take your baby on a date, you'd be amazed how much of your couple time is spent feeding the baby, diapering the baby, admiring the baby, and talking about the baby. Dating: Try to take time for just the two of you. Babysitter tip: Sometimes when moms are breastfeeding a newborn, they find it hard to figure out how to schedule outings around baby's unpredictable feeding schedules. Have the babysitter come for a three hour period. When she comes over, if baby has just eaten, you can leave right away, and come back in two hours. If baby hasn't fed recently, you start the date at home. Once baby gets hungry and gets a good feeding in, you can head out for the rest of your date time. Date Nights at Home: If you can't get out, at least set aside one evening a week to prioritize your relationship: whenever baby dozes off or doesn't have any immediate needs, spend a little time focusing on each other. Potential conflicts: The main relationship conflicts that come up for new parents are: issues related to sexuality, financial stresses, a feeling of an unfair division of household tasks, and conflicts  in parenting styles. The more you can work on these issues before baby arrives, the better!  Fun and Frills (Don't forget these. and don't feel guilty for indulging in them!) Everyone has something in life that is a fun little treat that they do just for themselves. It may be: reading the morning paper, or going for a daily jog, or having coffee with a friend once a week, or going to a movie on Friday nights, or fine chocolates, or bubble baths, or curling up with a good  book. Unless you do fun things for yourself every now and then, it's hard to have the energy for fun with your baby. Whatever your "special" treats are, make sure you find a way to continue to indulge in them after your baby is born. These special moments can recharge you, and allow you to return to baby with a new joy   PERINATAL MOOD DISORDERS: [employer]   _________________________________________Emergency and Crisis Resources If you are an imminent risk to self or others, are experiencing intense personal distress, and/or have noticed significant changes in activities of daily living, call:  [place]: [phone]  [address] [place], [place], [address] Mobile Crisis: [place]: [phone] Or visit the following crisis centers: Local Emergency Departments [facility]: [address], [place]. Hours: 8:30AM-5PM. Insurance Accepted: Medicaid, Medicare, and Uninsured.  [facility]:  [address] [place]  Mon-Friday 8am-3pm, [phone]                                                                                  ___________ Non-Crisis Resources To identify specific providers that are covered by your insurance, contact your insurance company or local agencies:  [employer]: [phone] [facility] and [employer]: [employer]: [phone] Postpartum Support International- Warm-line: 3646912288                                                      __Outpatient Therapy and Medication Management   Providers:  [facility]: [phone] Hours: 9AM-5PM  Insurance Accepted: Alben Spittle, Shane Crutch, [place], [place] Total Access Care [facility] Care): [phone] Hours: 8AM-5:30PM  nsurance Accepted: All insurances EXCEPT AARP, [place],  [place], and [place]: [phone] Hours: 8AM-8PM Insurance Accepted: Cristal Ford, Freddrick March, [place], Medicare, [facility]503 [phone] [facility]: [phone] Hours: 8:30AM-7PM Insurance Accepted: Cristal Ford, Medicaid, Medicare, Tricare, [employer] Counseling:  [place] Accepted:  Holland Falling, Lorella Nimrod, [employer], [place]: [phone] Hours: 9AM-5:30PM Insurance Accepted: Alben Spittle, Charlotte Crumb, and Medicaid, Medicare, [facility]:  [phone] Hours: 9am-5pm Insurance Accepted: BCBS; they do not accept Medicaid/Medicare [facility] [place]: [phone] Hours: 9am-9pm Insurance Accepted: All major insurance including Medicaid and Medicare [facility]: [phone] Hours: [place] Accepted: All insurances  EXCEPT Medicaid and Medicare. [facility]: [phone]   ____________                                                                     [place]: [phone] [place]:  [place]: (support for children in the NICU and/or with special needs), [phone]   ___________                                                                 Mental Health Support Groups Mental Health Association: [phone]    _____________                                                                                  Online Resources Postpartum Support International: http://jones-berg.com/  800-944-4PPD 2Moms Supporting Moms:  www.momssupportingmoms.net

## 2021-01-12 ENCOUNTER — Ambulatory Visit: Payer: Managed Care, Other (non HMO)

## 2021-01-12 NOTE — Progress Notes (Signed)
Attestation of Attending Supervision of clinical support staff: I agree with the care provided to this patient and was available for any consultation.  I have reviewed the RN's note and chart. I was available for consult and to see the patient if needed.   Bary Limbach MD MPH Attending Physician Faculty Practice- [facility]  

## 2021-01-12 NOTE — BH Specialist Note (Deleted)
Integrated Behavioral Health via Telemedicine Visit  01/12/2021 Susan Kaiser [id number]  Number of Integrated Behavioral Health visits: *** Session Start time: 3:15***  Session End time: 3:45*** Total time: {IBH Total IFOY:[id number]}  Referring Provider: *** Patient/Family location: Home*** [facility] Provider location: [facility]  All persons participating in visit: Patient *** and Susan Kaiser ***  Types of Service: {CHL AMB TYPE OF [phone]}  I connected with Susan Kaiser and/or Susan Kaiser's {family members:20773} via  Telephone or Engineer, civil (consulting)  (Video is Surveyor, mining) and verified that I am speaking with the correct person using two identifiers. Discussed confidentiality: {YES/NO:21197}  I discussed the limitations of telemedicine and the availability of in person appointments.  Discussed there is a possibility of technology failure and discussed alternative modes of communication if that failure occurs.  I discussed that engaging in this telemedicine visit, they consent to the provision of behavioral healthcare and the services will be billed under their insurance.  Patient and/or legal guardian expressed understanding and consented to Telemedicine visit: {YES/NO:21197}  Presenting Concerns: Patient and/or family reports the following symptoms/concerns: *** Duration of problem: ***; Severity of problem: {Mild/Moderate/Severe:20260}  Patient and/or Family's Strengths/Protective Factors: {CHL AMB BH PROTECTIVE [phone]}  Goals Addressed: Patient will:  Reduce symptoms of: {IBH Symptoms:21014056}   Increase knowledge and/or ability of: {IBH Patient Tools:21014057}   Demonstrate ability to: {IBH Goals:21014053}  Progress towards Goals: {CHL AMB BH PROGRESS TOWARDS GOALS:[phone]}  Interventions: Interventions utilized:  {IBH  Interventions:21014054} Standardized Assessments completed: {IBH Screening Tools:21014051}  Patient and/or Family Response: ***  Assessment: Patient currently experiencing ***.   Patient may benefit from ***.  Plan: Follow up with behavioral health clinician on : *** Behavioral recommendations: *** Referral(s): {IBH Referrals:21014055}  I discussed the assessment and treatment plan with the patient and/or parent/guardian. They were provided an opportunity to ask questions and all were answered. They agreed with the plan and demonstrated an understanding of the instructions.   They were advised to call back or seek an in-person evaluation if the symptoms worsen or if the condition fails to improve as anticipated.  Valetta Close Concepcion Gillott, LCSW

## 2021-01-13 ENCOUNTER — Other Ambulatory Visit: Payer: Self-pay

## 2021-01-13 ENCOUNTER — Ambulatory Visit (INDEPENDENT_AMBULATORY_CARE_PROVIDER_SITE_OTHER): Payer: Managed Care, Other (non HMO) | Admitting: Obstetrics and Gynecology

## 2021-01-13 ENCOUNTER — Ambulatory Visit: Payer: Managed Care, Other (non HMO) | Attending: Obstetrics and Gynecology | Admitting: *Deleted

## 2021-01-13 ENCOUNTER — Encounter: Payer: Self-pay | Admitting: *Deleted

## 2021-01-13 ENCOUNTER — Ambulatory Visit (HOSPITAL_BASED_OUTPATIENT_CLINIC_OR_DEPARTMENT_OTHER): Payer: Managed Care, Other (non HMO)

## 2021-01-13 ENCOUNTER — Encounter: Payer: Self-pay | Admitting: Obstetrics and Gynecology

## 2021-01-13 VITALS — BP 133/78 | HR 105

## 2021-01-13 VITALS — BP 133/80 | HR 100 | Wt >= 6400 oz

## 2021-01-13 DIAGNOSIS — Z3689 Encounter for other specified antenatal screening: Secondary | ICD-10-CM

## 2021-01-13 DIAGNOSIS — O99210 Obesity complicating pregnancy, unspecified trimester: Secondary | ICD-10-CM

## 2021-01-13 DIAGNOSIS — E669 Obesity, unspecified: Secondary | ICD-10-CM | POA: Insufficient documentation

## 2021-01-13 DIAGNOSIS — Z6841 Body Mass Index (BMI) 40.0 and over, adult: Secondary | ICD-10-CM

## 2021-01-13 DIAGNOSIS — Z3A37 37 weeks gestation of pregnancy: Secondary | ICD-10-CM | POA: Insufficient documentation

## 2021-01-13 DIAGNOSIS — D573 Sickle-cell trait: Secondary | ICD-10-CM

## 2021-01-13 DIAGNOSIS — E119 Type 2 diabetes mellitus without complications: Secondary | ICD-10-CM | POA: Diagnosis not present

## 2021-01-13 DIAGNOSIS — O99213 Obesity complicating pregnancy, third trimester: Secondary | ICD-10-CM

## 2021-01-13 DIAGNOSIS — O9902 Anemia complicating childbirth: Secondary | ICD-10-CM

## 2021-01-13 DIAGNOSIS — O24113 Pre-existing diabetes mellitus, type 2, in pregnancy, third trimester: Secondary | ICD-10-CM

## 2021-01-13 DIAGNOSIS — O10019 Pre-existing essential hypertension complicating pregnancy, unspecified trimester: Secondary | ICD-10-CM

## 2021-01-13 DIAGNOSIS — Z3480 Encounter for supervision of other normal pregnancy, unspecified trimester: Secondary | ICD-10-CM

## 2021-01-13 DIAGNOSIS — O24319 Unspecified pre-existing diabetes mellitus in pregnancy, unspecified trimester: Secondary | ICD-10-CM

## 2021-01-13 NOTE — Progress Notes (Signed)
   PRENATAL VISIT NOTE  Subjective:  Susan Kaiser is a 25 y.o. G2P1001 at [redacted]w[redacted]d being seen today for ongoing prenatal care.  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  Patient reports no complaints.  Contractions: Irritability. Vag. Bleeding: None.  Movement: Present. Denies leaking of fluid.   The following portions of the patient's history were reviewed and updated as appropriate: allergies, current medications, past family history, past medical history, past social history, past surgical history and problem list.   Objective:   Vitals:   01/13/21 1335 01/13/21 1400  BP: (!) 144/82 133/80  Pulse: 86 100  Weight: (!) 401 lb (181.9 kg)     Fetal Status: Fetal Heart Rate (bpm): 145 Fundal Height: 37 cm Movement: Present     General:  Alert, oriented and cooperative. Patient is in no acute distress.  Skin: Skin is warm and dry. No rash noted.   Cardiovascular: Normal heart rate noted  Respiratory: Normal respiratory effort, no problems with respiration noted  Abdomen: Soft, gravid, appropriate for gestational age.  Pain/Pressure: Present     Pelvic: Cervical exam deferred        Extremities: Normal range of motion.  Edema: None  Mental Status: Normal mood and affect. Normal behavior. Normal judgment and thought content.   Assessment and Plan:  Pregnancy: G2P1001 at [redacted]w[redacted]d 1. Supervision of  other normal pregnancy, antepartum Patient is doing well without complaints  2. Chronic benign essential hypertension, antepartum BP stable without medication Plan for IOL at 39 weeks Follow up growth ultrasound report and BPP from earlier today  3. Pre-existing diabetes mellitus affecting pregnancy, antepartum CBGs all within range Continue diet control  4. Obesity affecting pregnancy, antepartum    Term labor symptoms and general obstetric precautions including but not limited to vaginal bleeding, contractions, leaking of fluid and fetal movement were reviewed in detail with the patient. Please refer to After Visit Summary for other counseling recommendations.   No follow-ups on file.  Future Appointments  Date Time Provider Department Center  01/19/2021  9:35 AM Levie Heritage, DO [facility] None  01/25/2021  3:15 PM [facility]    Catalina Antigua, MD

## 2021-01-14 ENCOUNTER — Other Ambulatory Visit (HOSPITAL_COMMUNITY): Payer: Self-pay | Admitting: Advanced Practice Midwife

## 2021-01-18 ENCOUNTER — Other Ambulatory Visit: Payer: Self-pay | Admitting: Obstetrics and Gynecology

## 2021-01-18 MED ORDER — HYDROXYZINE PAMOATE 25 MG PO CAPS: 25.0000 mg | ORAL_CAPSULE | Freq: Three times a day (TID) | ORAL | 0 refills | Status: DC | PRN

## 2021-01-19 ENCOUNTER — Ambulatory Visit (INDEPENDENT_AMBULATORY_CARE_PROVIDER_SITE_OTHER): Payer: Managed Care, Other (non HMO) | Admitting: Family Medicine

## 2021-01-19 ENCOUNTER — Encounter: Payer: Self-pay | Admitting: Obstetrics & Gynecology

## 2021-01-19 ENCOUNTER — Other Ambulatory Visit: Payer: Self-pay

## 2021-01-19 VITALS — BP 104/66 | HR 94 | Wt >= 6400 oz

## 2021-01-19 DIAGNOSIS — O0993 Supervision of high risk pregnancy, unspecified, third trimester: Secondary | ICD-10-CM | POA: Diagnosis not present

## 2021-01-19 DIAGNOSIS — D573 Sickle-cell trait: Secondary | ICD-10-CM

## 2021-01-19 DIAGNOSIS — Z3A38 38 weeks gestation of pregnancy: Secondary | ICD-10-CM | POA: Diagnosis not present

## 2021-01-19 DIAGNOSIS — O9902 Anemia complicating childbirth: Secondary | ICD-10-CM

## 2021-01-19 DIAGNOSIS — E119 Type 2 diabetes mellitus without complications: Secondary | ICD-10-CM

## 2021-01-19 DIAGNOSIS — Z6841 Body Mass Index (BMI) 40.0 and over, adult: Secondary | ICD-10-CM

## 2021-01-19 DIAGNOSIS — O10019 Pre-existing essential hypertension complicating pregnancy, unspecified trimester: Secondary | ICD-10-CM

## 2021-01-19 NOTE — Progress Notes (Signed)
Subjective:  Susan Kaiser is a 25 y.o. G2P1001 at [redacted]w[redacted]d being seen today for ongoing prenatal care.  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  GDM: Diet controlled and checks sugars 4 times a day. Has had no elevated sugars in the last two weeks.  Reports 0 hypoglycemic episodes.   Patient reports no complaints.  Contractions: Irregular. Vag. Bleeding: None.  Movement: Present. Denies leaking of fluid.   The following portions of the patient's history were reviewed and updated as appropriate: allergies, current medications, past family history, past medical history, past social history, past surgical history and problem list. Problem list updated.  Objective:   Vitals:   01/19/21 0958  BP: 104/66  Pulse: 94  Weight: (!) 400 lb (181.4 kg)    Fetal Status: Fetal Heart Rate (bpm): 156   Movement: Present     General:  Alert, oriented and cooperative. Patient is in no acute distress.  Skin: Skin is warm and dry. No rash noted.   Cardiovascular: Normal heart rate noted  Respiratory: Normal respiratory effort, no problems with respiration noted  Abdomen: Soft, gravid, appropriate for gestational age. Pain/Pressure: Present     Pelvic: Vag. Bleeding: None     Cervical exam deferred        Extremities: Normal range of motion.  Edema: None  Mental Status: Normal mood and affect. Normal behavior. Normal  judgment and thought content.   Urinalysis:      Assessment and Plan:  Pregnancy: G2P1001 at [redacted]w[redacted]d  1. [redacted] weeks gestation of pregnancy Progressing well. Pt without complaints. Scheduled induction on 12/18.  2.   Chronic benign essential hypertension, antepartum -BP at goal today without medication -Growth [place] from 01/14/21 reviewed showing EFW of 3341 gm at 69% -IOL at 39 weeks (01/23/21)   3.   Pre-existing diabetes mellitus affecting pregnancy, antepartum -CBGs all within range -Continue diet controlled approach  Term labor symptoms and general obstetric precautions including but not limited to vaginal bleeding, contractions, leaking of fluid and fetal movement were reviewed in detail with the patient. Please refer to After Visit Summary for other counseling recommendations.  No follow-ups on file.   Judieth Keens, Medical Student

## 2021-01-20 ENCOUNTER — Ambulatory Visit (HOSPITAL_BASED_OUTPATIENT_CLINIC_OR_DEPARTMENT_OTHER): Payer: Managed Care, Other (non HMO) | Admitting: *Deleted

## 2021-01-20 ENCOUNTER — Ambulatory Visit: Payer: Managed Care, Other (non HMO) | Admitting: *Deleted

## 2021-01-20 VITALS — BP 137/84 | HR 102

## 2021-01-20 DIAGNOSIS — Z6841 Body Mass Index (BMI) 40.0 and over, adult: Secondary | ICD-10-CM

## 2021-01-20 DIAGNOSIS — O0993 Supervision of high risk pregnancy, unspecified, third trimester: Secondary | ICD-10-CM

## 2021-01-20 DIAGNOSIS — O24113 Pre-existing diabetes mellitus, type 2, in pregnancy, third trimester: Secondary | ICD-10-CM | POA: Insufficient documentation

## 2021-01-20 DIAGNOSIS — Z3A38 38 weeks gestation of pregnancy: Secondary | ICD-10-CM

## 2021-01-20 DIAGNOSIS — O24319 Unspecified pre-existing diabetes mellitus in pregnancy, unspecified trimester: Secondary | ICD-10-CM

## 2021-01-20 DIAGNOSIS — O9902 Anemia complicating childbirth: Secondary | ICD-10-CM

## 2021-01-20 NOTE — Progress Notes (Signed)
° °  PRENATAL VISIT NOTE  Subjective:  Susan Kaiser is a 25 y.o. G2P1001 at [redacted]w[redacted]d being seen today for ongoing prenatal care.  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  Patient reports no complaints.  Contractions: Irregular. Vag. Bleeding: None.  Movement: Present. Denies leaking of fluid.   The following portions of the patient's history were reviewed and updated as appropriate: allergies, current medications, past family history, past medical history, past social history, past surgical history and problem list.   Objective:   Vitals:   01/19/21 0958  BP: 104/66  Pulse: 94  Weight: (!) 400 lb (181.4 kg)    Fetal Status: Fetal Heart Rate (bpm): 156   Movement: Present     General:  Alert, oriented and cooperative. Patient is in no acute distress.  Skin: Skin is warm and dry. No rash noted.   Cardiovascular: Normal heart rate noted  Respiratory: Normal respiratory effort, no problems with respiration noted  Abdomen: Soft, gravid, appropriate for gestational age.  Pain/Pressure: Present     Pelvic: Cervical exam deferred        Extremities: Normal range of motion.  Edema: None  Mental Status: Normal mood and affect. Normal behavior. Normal judgment and thought content.   Assessment and Plan:  Pregnancy: G2P1001 at [redacted]w[redacted]d 1. [redacted] weeks gestation of pregnancy  2. Supervision of high risk pregnancy in third  trimester FHT and FH normal  3. Type 2 diabetes mellitus without complication, without long-term current use of insulin (HCC) Growth [place] 12/8 - 69% On ASA 81mg  Induction scheduled at 39 weeks  4. Chronic benign essential hypertension, antepartum Controlled  5. BMI 60.0-69.9, adult (HCC)   6. 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 Please refer to After Visit Summary for other counseling recommendations.   No follow-ups on file.  Future Appointments  Date Time Provider Department Center  01/20/2021  1:00 PM [facility] NURSE [facility]  01/20/2021  1:15 PM [facility] NST [facility]  01/23/2021  7:00 AM [facility] SCHED ROOM [facility] None  01/25/2021  3:15 PM [facility]    [facility] DO

## 2021-01-20 NOTE — Procedures (Signed)
Susan Kaiser 29-Jun-1995 [redacted]w[redacted]d  Fetus A Non-Stress Test Interpretation for 01/20/21  Indication: Diabetes, CHTN, Obesity  Fetal Heart Rate A Mode: External Baseline Rate (A): 150 bpm Variability: Moderate Accelerations: 15 x 15 Decelerations: None Multiple birth?: No  Uterine Activity Mode: Palpation, Toco Contraction Frequency (min): 1 uc Contraction Duration (sec): 80 Contraction Quality: Mild Resting Tone Palpated: Relaxed Resting Time: Adequate  Interpretation (Fetal Testing) Nonstress Test Interpretation: Reactive Overall Impression: Reassuring for gestational age Comments: Dr.Fang reviewed tracing

## 2021-01-23 ENCOUNTER — Inpatient Hospital Stay (HOSPITAL_COMMUNITY): Payer: Managed Care, Other (non HMO)

## 2021-01-23 ENCOUNTER — Other Ambulatory Visit: Payer: Self-pay

## 2021-01-23 ENCOUNTER — Inpatient Hospital Stay (HOSPITAL_COMMUNITY)
Admission: AD | Admit: 2021-01-23 | Discharge: 2021-01-26 | DRG: 805 | Disposition: A | Discharge status: Home / Self Care | Payer: Managed Care, Other (non HMO) | Attending: Family Medicine | Admitting: Family Medicine

## 2021-01-23 DIAGNOSIS — O24319 Unspecified pre-existing diabetes mellitus in pregnancy, unspecified trimester: Secondary | ICD-10-CM | POA: Diagnosis present

## 2021-01-23 DIAGNOSIS — O99344 Other mental disorders complicating childbirth: Secondary | ICD-10-CM | POA: Diagnosis present

## 2021-01-23 DIAGNOSIS — F339 Major depressive disorder, recurrent, unspecified: Secondary | ICD-10-CM | POA: Diagnosis present

## 2021-01-23 DIAGNOSIS — R748 Abnormal levels of other serum enzymes: Secondary | ICD-10-CM | POA: Diagnosis not present

## 2021-01-23 DIAGNOSIS — D573 Sickle-cell trait: Secondary | ICD-10-CM | POA: Diagnosis present

## 2021-01-23 DIAGNOSIS — O0993 Supervision of high risk pregnancy, unspecified, third trimester: Secondary | ICD-10-CM

## 2021-01-23 DIAGNOSIS — Z6841 Body Mass Index (BMI) 40.0 and over, adult: Secondary | ICD-10-CM

## 2021-01-23 DIAGNOSIS — O9902 Anemia complicating childbirth: Secondary | ICD-10-CM | POA: Diagnosis present

## 2021-01-23 DIAGNOSIS — Z3A39 39 weeks gestation of pregnancy: Secondary | ICD-10-CM | POA: Diagnosis not present

## 2021-01-23 DIAGNOSIS — O10019 Pre-existing essential hypertension complicating pregnancy, unspecified trimester: Secondary | ICD-10-CM | POA: Diagnosis present

## 2021-01-23 DIAGNOSIS — O2412 Pre-existing diabetes mellitus, type 2, in childbirth: Secondary | ICD-10-CM | POA: Diagnosis present

## 2021-01-23 DIAGNOSIS — Z3490 Encounter for supervision of normal pregnancy, unspecified, unspecified trimester: Secondary | ICD-10-CM | POA: Diagnosis present

## 2021-01-23 DIAGNOSIS — Z20822 Contact with and (suspected) exposure to covid-19: Secondary | ICD-10-CM | POA: Diagnosis present

## 2021-01-23 DIAGNOSIS — Z7982 Long term (current) use of aspirin: Secondary | ICD-10-CM

## 2021-01-23 DIAGNOSIS — E119 Type 2 diabetes mellitus without complications: Secondary | ICD-10-CM

## 2021-01-23 DIAGNOSIS — O1092 Unspecified pre-existing hypertension complicating childbirth: Secondary | ICD-10-CM | POA: Diagnosis not present

## 2021-01-23 DIAGNOSIS — F411 Generalized anxiety disorder: Secondary | ICD-10-CM | POA: Diagnosis present

## 2021-01-23 DIAGNOSIS — O0990 Supervision of high risk pregnancy, unspecified, unspecified trimester: Secondary | ICD-10-CM

## 2021-01-23 DIAGNOSIS — O99214 Obesity complicating childbirth: Secondary | ICD-10-CM | POA: Diagnosis present

## 2021-01-23 DIAGNOSIS — O99210 Obesity complicating pregnancy, unspecified trimester: Secondary | ICD-10-CM | POA: Diagnosis present

## 2021-01-23 DIAGNOSIS — O1002 Pre-existing essential hypertension complicating childbirth: Principal | ICD-10-CM | POA: Diagnosis present

## 2021-01-23 LAB — COMPREHENSIVE METABOLIC PANEL
ALT: 66 U/L — ABNORMAL HIGH (ref 0–44)
AST: 36 U/L (ref 15–41)
Albumin: 2.6 g/dL — ABNORMAL LOW (ref 3.5–5.0)
Alkaline Phosphatase: 131 U/L — ABNORMAL HIGH (ref 38–126)
Anion gap: 8 (ref 5–15)
BUN: <5 mg/dL — ABNORMAL LOW (ref 6–20)
CO2: 21 mmol/L — ABNORMAL LOW (ref 22–32)
Calcium: 8.6 mg/dL — ABNORMAL LOW (ref 8.9–10.3)
Chloride: 107 mmol/L (ref 98–111)
Creatinine, Ser: 0.82 mg/dL (ref 0.44–1.00)
GFR, Estimated: >60 mL/min (ref >60)
Glucose, Bld: 117 mg/dL — ABNORMAL HIGH (ref 70–99)
Potassium: 3.9 mmol/L (ref 3.5–5.1)
Sodium: 136 mmol/L (ref 135–145)
Total Bilirubin: 0.3 mg/dL (ref 0.3–1.2)
Total Protein: 5.9 g/dL — ABNORMAL LOW (ref 6.5–8.1)

## 2021-01-23 LAB — CBC
HCT: 32.5 % — ABNORMAL LOW (ref 36.0–46.0)
Hemoglobin: 10.7 g/dL — ABNORMAL LOW (ref 12.0–15.0)
MCH: 24.5 pg — ABNORMAL LOW (ref 26.0–34.0)
MCHC: 32.9 g/dL (ref 30.0–36.0)
MCV: 74.5 fL — ABNORMAL LOW (ref 80.0–100.0)
Platelets: 174 10*3/uL (ref 150–400)
RBC: 4.36 MIL/uL (ref 3.87–5.11)
RDW: 15.8 % — ABNORMAL HIGH (ref 11.5–15.5)
WBC: 7.8 10*3/uL (ref 4.0–10.5)
nRBC: 0 % (ref 0.0–0.2)

## 2021-01-23 LAB — PROTEIN / CREATININE RATIO, URINE
Creatinine, Urine: 135.39 mg/dL
Protein Creatinine Ratio: 0.09 mg/mg{Cre} (ref 0.00–0.15)
Total Protein, Urine: 12 mg/dL

## 2021-01-23 LAB — RESP PANEL BY RT-PCR (FLU A&B, COVID) ARPGX2
Influenza A by PCR: NEGATIVE
Influenza B by PCR: NEGATIVE
SARS Coronavirus 2 by RT PCR: NEGATIVE

## 2021-01-23 LAB — TYPE AND SCREEN
ABO/RH(D): AB POS
Antibody Screen: NEGATIVE

## 2021-01-23 LAB — GLUCOSE, CAPILLARY: Glucose-Capillary: 87 mg/dL (ref 70–99)

## 2021-01-23 MED ORDER — MISOPROSTOL 50MCG HALF TABLET
50.0000 ug | ORAL_TABLET | ORAL | Status: DC
  Administered 2021-01-23: 17:00:00 50 ug via BUCCAL

## 2021-01-23 MED ORDER — LIDOCAINE HCL (PF) 1 % IJ SOLN: 30.0000 mL | INTRAMUSCULAR | Status: DC | PRN

## 2021-01-23 MED ORDER — SERTRALINE HCL 100 MG PO TABS
100.0000 mg | ORAL_TABLET | Freq: Every day | ORAL | Status: DC
  Administered 2021-01-23 – 2021-01-25 (×3): 100 mg via ORAL
  Filled 2021-01-23 (×5): qty 1

## 2021-01-23 MED ORDER — ACETAMINOPHEN 325 MG PO TABS: 650.0000 mg | ORAL_TABLET | ORAL | Status: DC | PRN

## 2021-01-23 MED ORDER — OXYTOCIN BOLUS FROM INFUSION
333.0000 mL | Freq: Once | INTRAVENOUS | Status: AC
  Administered 2021-01-24: 12:00:00 333 mL via INTRAVENOUS

## 2021-01-23 MED ORDER — ONDANSETRON HCL 4 MG/2ML IJ SOLN
4.0000 mg | Freq: Four times a day (QID) | INTRAMUSCULAR | Status: DC | PRN
  Administered 2021-01-24 (×2): 4 mg via INTRAVENOUS
  Filled 2021-01-23 (×2): qty 2

## 2021-01-23 MED ORDER — OXYTOCIN-SODIUM CHLORIDE 30-0.9 UT/500ML-% IV SOLN: 2.5000 [IU]/h | INTRAVENOUS | Status: DC

## 2021-01-23 MED ORDER — OXYCODONE-ACETAMINOPHEN 5-325 MG PO TABS: 1.0000 | ORAL_TABLET | ORAL | Status: DC | PRN

## 2021-01-23 MED ORDER — LACTATED RINGERS IV SOLN: 500.0000 mL | INTRAVENOUS | Status: DC | PRN

## 2021-01-23 MED ORDER — MISOPROSTOL 50MCG HALF TABLET
ORAL_TABLET | ORAL | Status: AC
  Filled 2021-01-23: qty 1

## 2021-01-23 MED ORDER — SOD CITRATE-CITRIC ACID 500-334 MG/5ML PO SOLN: 30.0000 mL | ORAL | Status: DC | PRN

## 2021-01-23 MED ORDER — OXYCODONE-ACETAMINOPHEN 5-325 MG PO TABS: 2.0000 | ORAL_TABLET | ORAL | Status: DC | PRN

## 2021-01-23 MED ORDER — LACTATED RINGERS IV SOLN: INTRAVENOUS | Status: DC

## 2021-01-23 NOTE — H&P (Signed)
OBSTETRIC ADMISSION HISTORY AND PHYSICAL  Susan Kaiser is a 25 y.o. female G2P1001 with IUP at [redacted]w[redacted]d by LMP presenting for IOL for cHTN and T2DM. She reports +FMs, No LOF, no VB, no blurry vision, headaches or peripheral edema, and RUQ pain.  She plans on bottle feeding. She is undecided regarding birth control. She received her prenatal care at  [facility]    Dating: By LMP --->  Estimated Date of Delivery: 01/30/21  Sono:   @[redacted]w[redacted]d , CWD, normal anatomy, cephalic presentation, anterior placental lie, 3341g, 69% EFW   Prenatal History/Complications:  cHTN Depression  T2DM Sickle cell disease carrier on horizon  Past Medical History: Past Medical History:  Diagnosis Date   Depression    Diabetes mellitus without complication ([place])    Dysmenorrhea 09/17/2014   Generalized anxiety disorder    Menorrhagia 04/21/2013   Menstrual extraction 05/26/2013   Obesity    PCO (polycystic ovaries) 06/10/2013   Unspecified symptom associated with female genital organs 06/10/2013    Past Surgical History: Past Surgical History:  Procedure Laterality Date   EXTERNAL EAR SURGERY     stuck a corn kernnel in her ear when she was 4     Obstetrical History: OB History     Gravida  2   Para  1   Term  1   Preterm      AB      Living  1      SAB      IAB      Ectopic      Multiple      Live Births  1           Social History Social History   Socioeconomic History   Marital status: Single    Spouse name: Not on file   Number of children: Not on file   Years of education: Not on file   Highest education level: Not on file  Occupational History   Not on file  Tobacco Use   Smoking status: Never   Smokeless tobacco: Never  Vaping Use   Vaping Use: Never used  Substance and Sexual Activity   Alcohol use: No   Drug use: No   Sexual activity: Yes    Birth control/protection: None  Other Topics Concern   Not on file  Social History Narrative   Not on  file   Social Determinants of Health   Financial Resource Strain: Not on file  Food Insecurity: Not on file  Transportation Needs: Not on file  Physical Activity: Not on file  Stress: Not on file  Social Connections: Not on file    Family History: Family History  Problem Relation Age of Onset   Diabetes Paternal Aunt    Diabetes Paternal Uncle    Diabetes Maternal Grandmother    Diabetes Maternal Grandfather    Other Mother        cysts on ovaries   Heart disease Father     Allergies: Allergies  Allergen Reactions   Amoxicillin Anaphylaxis, Itching and Rash    Medications Prior to Admission  Medication Sig Dispense Refill Last Dose   Accu-Chek Softclix Lancets lancets 100 each by Other route 4 (four) times daily. 100 each 12    aspirin EC 81 MG tablet Take 81 mg by mouth daily. Swallow whole.      Doxylamine-Pyridoxine 10-10 MG TBEC Take 2 tablets by mouth at bedtime. If needed, take 1 additional tablet in the morning and  1 additional tablet in the afternoon. Max of 4 tablets per day 120 tablet 0    glucose blood (ACCU-CHEK GUIDE) test strip DX: O24:[id number]; check BS QID 100 each 12    prenatal vitamin w/FE, FA (NATACHEW) 29-1 MG CHEW chewable tablet Chew 1 tablet by mouth daily at 12 noon.      sertraline (ZOLOFT) 100 MG tablet Take 1 tablet (100 mg total) by mouth daily. 90 tablet 3      Review of Systems   All systems reviewed and negative except as stated in HPI  Blood pressure 130/70, pulse (!) 105, temperature 99.5 F (37.5 C), temperature source Oral, resp. rate 16, height 5\' 10"  (1.778 m), weight (!) 181.9 kg, last menstrual period 04/25/2020, SpO2 97 %. General appearance: alert and no distress Lungs: clear to auscultation bilaterally Heart: regular rate and rhythm Abdomen: soft, non-tender; bowel sounds normal Extremities: Homans sign is negative, no sign of DVT Presentation: cephalic confirmed via BSUS Fetal monitoring: difficult to trace given  distribution of maternal tissue. When tracing Baseline: 150 bpm, Variability: Good {> 6 bpm), Accelerations: Reactive, and Decelerations: Absent Uterine activityNone Dilation: Fingertip Effacement (%): Thick Exam by:: Dr [email or web address]   Prenatal labs: ABO, Rh: --/--/AB POS (12/18 1615) Antibody: NEG (12/18 1615) Rubella: 11.10 (06/03 1026) RPR: Non Reactive (09/15 0859)  HBsAg: Negative (06/03 1026)  HIV: Non Reactive (09/15 0859)  GBS: Negative/-- (12/01 1443)  2 hr Glucola not done, checking fingersticks given known T2DM Genetic screening  sickle cell carrier Anatomy 08-23-1999 normal  Prenatal Transfer Tool  Maternal Diabetes: Yes:  Diabetes Type:  Pre-pregnancy Genetic Screening: Abnormal:  Results: Other:sickle cell carrier, LR NIPS Maternal Ultrasounds/Referrals: Normal Fetal Ultrasounds or other Referrals:  Fetal echo- normal (seen in CareEverywhere) Maternal Substance Abuse:  No Significant Maternal Medications:  Meds include: Zoloft Significant Maternal Lab Results: Group B Strep negative  Results for orders placed or performed during the hospital encounter of 01/23/21 (from the past 24 hour(s))  CBC   Collection Time: 01/23/21  4:15 PM  Result Value Ref Range   WBC 7.8 4.0 - 10.5 K/uL   RBC 4.36 3.87 - 5.11 MIL/uL   Hemoglobin 10.7 (L) 12.0 - 15.0 g/dL   HCT 01/25/21 (L) 40.9 - 81.1 %   MCV 74.5 (L) 80.0 - 100.0 fL   MCH 24.5 (L) 26.0 - 34.0 pg   MCHC 32.9 30.0 - 36.0 g/dL   RDW 91.4 (H) 78.2 - 95.6 %   Platelets 174 150 - 400 K/uL   nRBC 0.0 0.0 - 0.2 %  Comprehensive metabolic panel   Collection Time: 01/23/21  4:15 PM  Result Value Ref Range   Sodium 136 135 - 145 mmol/L   Potassium 3.9 3.5 - 5.1 mmol/L   Chloride 107 98 - 111 mmol/L   CO2 21 (L) 22 - 32 mmol/L   Glucose, Bld 117 (H) 70 - 99 mg/dL   BUN <5 (L) 6 - 20 mg/dL   Creatinine, Ser 01/25/21 0.44 - 1.00 mg/dL   Calcium 8.6 (L) 8.9 - 10.3 mg/dL   Total Protein 5.9 (L) 6.5 - 8.1 g/dL   Albumin 2.6 (L) 3.5 - 5.0 g/dL    AST 36 15 - 41 U/L   ALT 66 (H) 0 - 44 U/L   Alkaline Phosphatase 131 (H) 38 - 126 U/L   Total Bilirubin 0.3 0.3 - 1.2 mg/dL   GFR, Estimated 0.86 >57 mL/min   Anion gap 8 5 - 15  Type and  screen   Collection Time: 01/23/21  4:15 PM  Result Value Ref Range   ABO/RH(D) AB POS    Antibody Screen NEG    Sample Expiration      01/26/2021,2359 Performed at [facility], [place] [address], [place], [place] [address]   Resp Panel by RT-PCR (Flu A&B, Covid) Nasopharyngeal Swab   Collection Time: 01/23/21  4:20 PM   Specimen: Nasopharyngeal Swab; Nasopharyngeal(NP) swabs in vial transport medium  Result Value Ref Range   SARS Coronavirus 2 by RT PCR NEGATIVE NEGATIVE   Influenza A by PCR NEGATIVE NEGATIVE   Influenza B by PCR NEGATIVE NEGATIVE    Patient Active Problem List   Diagnosis Date Noted   Encounter for induction of labor 01/23/2021   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 ([place]) 09/20/2020   Supervision of high-risk pregnancy 08/04/2020   Chronic benign essential hypertension, antepartum 08/04/2020   Pre-existing diabetes mellitus affecting pregnancy, antepartum 08/04/2020   BMI 60.0-69.9, adult ([place]) 08/04/2020   Post partum depression 06/16/2019   GAD (generalized anxiety disorder) 02/03/2016   Recurrent major depressive disorder ([place]) 02/03/2016   PCO (polycystic ovaries) 06/10/2013   Obesity affecting pregnancy, antepartum 04/21/2013   Hypertension 11/15/2011   Type 2 diabetes mellitus without complication, without long-term current use of insulin ([place]) 05/26/2011    Assessment/Plan:  Susan Kaiser is a 25 y.o. G2P1001 at [redacted]w[redacted]d here for IOL cHTN and T2DM  #Labor: Here for IOL. Cervix fingertip externally, closed internally. Will start with buccal cytotec 47mcg. Will reassess at next check for balloon placement  #Pain: Plans epidural #FWB: Cat I #ID:  GBS neg #MOF: bottle #MOC: undecided #Circ:  N/A  #cHTN Not on medications. Currently  normotensive.  - follow up admission labs  #T2DM Not on meds. Reports home range was 90s-110s. Will follow up BG on admission CMP. Will hold off on CBGs during labor at this time. If high on CMP can do q4hr checks.  #Depression/anxiety Patient on Zoloft 100mg . Takes at night. Continue while in hospital  Renard Matter, MD, MPH OB Fellow, Faculty Practice

## 2021-01-24 ENCOUNTER — Inpatient Hospital Stay (HOSPITAL_COMMUNITY): Payer: Managed Care, Other (non HMO) | Admitting: Anesthesiology

## 2021-01-24 ENCOUNTER — Encounter (HOSPITAL_COMMUNITY): Payer: Self-pay | Admitting: Obstetrics and Gynecology

## 2021-01-24 DIAGNOSIS — O1092 Unspecified pre-existing hypertension complicating childbirth: Secondary | ICD-10-CM

## 2021-01-24 DIAGNOSIS — O2412 Pre-existing diabetes mellitus, type 2, in childbirth: Secondary | ICD-10-CM

## 2021-01-24 DIAGNOSIS — Z3A39 39 weeks gestation of pregnancy: Secondary | ICD-10-CM

## 2021-01-24 LAB — COMPREHENSIVE METABOLIC PANEL
ALT: 81 U/L — ABNORMAL HIGH (ref 0–44)
AST: 49 U/L — ABNORMAL HIGH (ref 15–41)
Albumin: 2.5 g/dL — ABNORMAL LOW (ref 3.5–5.0)
Alkaline Phosphatase: 131 U/L — ABNORMAL HIGH (ref 38–126)
Anion gap: 10 (ref 5–15)
BUN: <5 mg/dL — ABNORMAL LOW (ref 6–20)
CO2: 21 mmol/L — ABNORMAL LOW (ref 22–32)
Calcium: 8.7 mg/dL — ABNORMAL LOW (ref 8.9–10.3)
Chloride: 105 mmol/L (ref 98–111)
Creatinine, Ser: 0.66 mg/dL (ref 0.44–1.00)
GFR, Estimated: >60 mL/min (ref >60)
Glucose, Bld: 84 mg/dL (ref 70–99)
Potassium: 4.2 mmol/L (ref 3.5–5.1)
Sodium: 136 mmol/L (ref 135–145)
Total Bilirubin: 0.7 mg/dL (ref 0.3–1.2)
Total Protein: 5.9 g/dL — ABNORMAL LOW (ref 6.5–8.1)

## 2021-01-24 LAB — GLUCOSE, CAPILLARY
Glucose-Capillary: 85 mg/dL (ref 70–99)
Glucose-Capillary: 89 mg/dL (ref 70–99)
Glucose-Capillary: 91 mg/dL (ref 70–99)
Glucose-Capillary: 83 mg/dL (ref 70–99)

## 2021-01-24 LAB — RPR: RPR Ser Ql: NONREACTIVE

## 2021-01-24 MED ORDER — TETANUS-DIPHTH-ACELL PERTUSSIS 5-2.5-18.5 LF-MCG/0.5 IM SUSY: 0.5000 mL | PREFILLED_SYRINGE | Freq: Once | INTRAMUSCULAR | Status: DC

## 2021-01-24 MED ORDER — ACETAMINOPHEN 325 MG PO TABS
650.0000 mg | ORAL_TABLET | ORAL | Status: DC | PRN
  Administered 2021-01-24 – 2021-01-25 (×4): 650 mg via ORAL
  Filled 2021-01-24 (×4): qty 2

## 2021-01-24 MED ORDER — IBUPROFEN 600 MG PO TABS
600.0000 mg | ORAL_TABLET | Freq: Four times a day (QID) | ORAL | Status: DC
  Administered 2021-01-24 (×2): 600 mg via ORAL
  Filled 2021-01-24 (×2): qty 1

## 2021-01-24 MED ORDER — SENNOSIDES-DOCUSATE SODIUM 8.6-50 MG PO TABS
2.0000 | ORAL_TABLET | ORAL | Status: DC
  Administered 2021-01-25: 16:00:00 2 via ORAL
  Filled 2021-01-24: qty 2

## 2021-01-24 MED ORDER — TERBUTALINE SULFATE 1 MG/ML IJ SOLN: 0.2500 mg | Freq: Once | INTRAMUSCULAR | Status: DC | PRN

## 2021-01-24 MED ORDER — DIPHENHYDRAMINE HCL 50 MG/ML IJ SOLN: 12.5000 mg | INTRAMUSCULAR | Status: DC | PRN

## 2021-01-24 MED ORDER — EPHEDRINE 5 MG/ML INJ: 10.0000 mg | INTRAVENOUS | Status: DC | PRN

## 2021-01-24 MED ORDER — FENTANYL-BUPIVACAINE-NACL 0.5-0.125-0.9 MG/250ML-% EP SOLN: 12.0000 mL/h | EPIDURAL | Status: DC | PRN

## 2021-01-24 MED ORDER — MISOPROSTOL 200 MCG PO TABS
ORAL_TABLET | ORAL | Status: AC
  Filled 2021-01-24: qty 4

## 2021-01-24 MED ORDER — ONDANSETRON HCL 4 MG PO TABS: 4.0000 mg | ORAL_TABLET | ORAL | Status: DC | PRN

## 2021-01-24 MED ORDER — DIBUCAINE (PERIANAL) 1 % EX OINT: 1.0000 "application " | TOPICAL_OINTMENT | CUTANEOUS | Status: DC | PRN

## 2021-01-24 MED ORDER — ONDANSETRON HCL 4 MG/2ML IJ SOLN: 4.0000 mg | INTRAMUSCULAR | Status: DC | PRN

## 2021-01-24 MED ORDER — SIMETHICONE 80 MG PO CHEW: 80.0000 mg | CHEWABLE_TABLET | ORAL | Status: DC | PRN

## 2021-01-24 MED ORDER — BENZOCAINE-MENTHOL 20-0.5 % EX AERO: 1.0000 "application " | INHALATION_SPRAY | CUTANEOUS | Status: DC | PRN

## 2021-01-24 MED ORDER — LACTATED RINGERS IV SOLN
500.0000 mL | Freq: Once | INTRAVENOUS | Status: AC
  Administered 2021-01-24: 04:00:00 500 mL via INTRAVENOUS

## 2021-01-24 MED ORDER — PHENYLEPHRINE 40 MCG/ML (10ML) SYRINGE FOR IV PUSH (FOR BLOOD PRESSURE SUPPORT): 80.0000 ug | PREFILLED_SYRINGE | INTRAVENOUS | Status: DC | PRN

## 2021-01-24 MED ORDER — MISOPROSTOL 200 MCG PO TABS
800.0000 ug | ORAL_TABLET | Freq: Once | ORAL | Status: AC
  Administered 2021-01-24: 12:00:00 800 ug via RECTAL

## 2021-01-24 MED ORDER — DIPHENHYDRAMINE HCL 25 MG PO CAPS: 25.0000 mg | ORAL_CAPSULE | Freq: Four times a day (QID) | ORAL | Status: DC | PRN

## 2021-01-24 MED ORDER — PRENATAL MULTIVITAMIN CH
1.0000 | ORAL_TABLET | Freq: Every day | ORAL | Status: DC
  Administered 2021-01-25: 11:00:00 1 via ORAL
  Filled 2021-01-24: qty 1

## 2021-01-24 MED ORDER — COCONUT OIL OIL: 1.0000 "application " | TOPICAL_OIL | Status: DC | PRN

## 2021-01-24 MED ORDER — WITCH HAZEL-GLYCERIN EX PADS: 1.0000 "application " | MEDICATED_PAD | CUTANEOUS | Status: DC | PRN

## 2021-01-24 MED ORDER — OXYTOCIN-SODIUM CHLORIDE 30-0.9 UT/500ML-% IV SOLN
1.0000 m[IU]/min | INTRAVENOUS | Status: DC
  Administered 2021-01-24: 07:00:00 2 m[IU]/min via INTRAVENOUS
  Filled 2021-01-24: qty 500

## 2021-01-24 MED ORDER — FENTANYL-BUPIVACAINE-NACL 0.5-0.125-0.9 MG/250ML-% EP SOLN
12.0000 mL/h | EPIDURAL | Status: DC | PRN
  Administered 2021-01-24: 04:00:00 12 mL/h via EPIDURAL
  Filled 2021-01-24: qty 250

## 2021-01-24 MED ORDER — LIDOCAINE HCL (PF) 1 % IJ SOLN
INTRAMUSCULAR | Status: DC | PRN
  Administered 2021-01-24: 8 mL via EPIDURAL

## 2021-01-24 NOTE — Progress Notes (Signed)
Patient ID: Susan Kaiser, female   DOB: November 02, 1995, 25 y.o.   MRN: [id number]   Susan Kaiser is a 25 y.o. G2P1001 at [redacted]w[redacted]d  admitted for induction of labor due to [facility] and Type2.  Subjective:  Resting comfortably, ready for another position change. Mother and friend at bedside.  Objective: Vitals:   01/24/21 0800 01/24/21 0830 01/24/21 0900 01/24/21 0935  BP: 132/68 125/68 122/71   Pulse: 80 82 94   Resp: 18 18 18 18   Temp: 98 F (36.7 C)     TempSrc: Oral     SpO2:      Weight:      Height:       Total I/O In: -  Out: 300 [Urine:300]  FHT:  baseline 135, moderate variability, + accels, + intermittent variable decels with contractions, overall reassuring UC:   q2-5 min, IUPC in place, MVU 120-170s, inadequate but making change SVE:   Dilation: 6 Effacement (%): 90 Station: -1 Exam by:: MD Kaesyn Johnston   Labs: Lab Results  Component Value Date   WBC 7.8 01/23/2021   HGB 10.7 (L) 01/23/2021   HCT 32.5 (L) 01/23/2021   MCV 74.5 (L) 01/23/2021   PLT 174 01/23/2021    Assessment / Plan:  s/p one cytotec and patient made change to 4 cm; now with AROM and IUPC placed; pitocin started.  PCR is 0.09;  VSS all night;   Labor: c/p cytotec x1, AROM and IUPC placed at 0700. Making change on pitocin, pit now at 4. Continue to titrate pitocin, IUPC in place.  Fetal Wellbeing:  Cat II, intermittent variable decels with contractions that have resolved with position changes, consider amnioinfusion if persistant but moderate variability and accels overall reassuring, continue to monitor closely Pain Control:  Epidural Anticipated MOD:  NSVD  #cHTN- normal to mild range blood pressures overnight. She is asymptomatic. Pre-E labs notable for ALT 66, discussed with Dr 01/25/2021 and Dr Donavan Foil, will repeat CMP now but with mild range BP no indication for Magnesium at this time.  #T2DM- last several BG in 80s, most recent 91. Not on any home meds. On recent growth scan at [redacted]w[redacted]d EFW 69%ile  (3341g). Continue to monitor every 2 hours now that in active labor.  [redacted]w[redacted]d 01/24/2021, 9:58 AM

## 2021-01-24 NOTE — Anesthesia Preprocedure Evaluation (Signed)
Anesthesia Evaluation  Patient identified by MRN, date of birth, ID band Patient awake    Reviewed: Allergy & Precautions, H&P , NPO status , Patient's Chart, lab work & pertinent test results, reviewed documented beta blocker date and time   Airway Mallampati: I  TM Distance: >3 FB Neck ROM: full    Dental no notable dental hx. (+) Teeth Intact, Dental Advisory Given   Pulmonary neg pulmonary ROS,    Pulmonary exam normal breath sounds clear to auscultation       Cardiovascular hypertension, Pt. on medications Normal cardiovascular exam Rhythm:regular Rate:Normal     Neuro/Psych PSYCHIATRIC DISORDERS Anxiety Depression negative neurological ROS     GI/Hepatic negative GI ROS, Neg liver ROS,   Endo/Other  diabetes, Type obesity  Renal/GU negative Renal ROS  negative genitourinary   Musculoskeletal   Abdominal   Peds  Hematology negative hematology ROS (+)   Anesthesia Other Findings   Reproductive/Obstetrics (+) Pregnancy                             Anesthesia Physical Anesthesia Plan  ASA: 3  Anesthesia Plan: Epidural   Post-op Pain Management:    Induction:   PONV Risk Score and Plan: 2 and Treatment may vary due to age or medical condition  Airway Management Planned: Natural Airway  Additional Equipment: None  Intra-op Plan:   Post-operative Plan:   Informed Consent: I have reviewed the patients History and Physical, chart, labs and discussed the procedure including the risks, benefits and alternatives for the proposed anesthesia with the patient or authorized representative who has indicated his/her understanding and acceptance.       Plan Discussed with: Anesthesiologist  Anesthesia Plan Comments:         Anesthesia Quick Evaluation

## 2021-01-24 NOTE — Progress Notes (Signed)
Patient ID: Susan Kaiser, female   DOB: September 02, 1995, 25 y.o.   MRN: [id number]   Susan Kaiser is a 25 y.o. G2P1001 at [redacted]w[redacted]d  admitted for induction of labor due to [facility] and Type 2 DM.   Subjective: Patient doing well, resting in bed.  Objective: Vitals:   01/24/21 0431 01/24/21 0437 01/24/21 0441 01/24/21 0446  BP: (!) 145/88 132/67 139/72 134/65  Pulse: (!) 104 (!) 101 88 91  Resp:      Temp:   97.9 F (36.6 C)   TempSrc:   Oral   SpO2: 99%     Weight:      Height:       No intake/output data recorded.  FHT:  FHR: 150 bpm, variability: moderate,  accelerations:  Present,  decelerations:  Absent UC:   irregular SVE:   last check at 1700 was 0.5 cm Pitocin @ 0 mu/min  Labs: Lab Results  Component Value Date   WBC 7.8 01/23/2021   HGB 10.7 (L) 01/23/2021   HCT 32.5 (L) 01/23/2021   MCV 74.5 (L) 01/23/2021   PLT 174 01/23/2021    Assessment / Plan: Doing well; resting in bed, would like to eat   Labor: transition to pitocin when appropriate, continue cytotec for now Fetal Wellbeing:  Category I Pain Control:  Labor support without medications Anticipated MOD:  NSVD  Charlesetta Garibaldi Susan Kaiser 01/24/2021, 5:08 AM

## 2021-01-24 NOTE — Discharge Summary (Signed)
Postpartum Discharge Summary     Patient Name: Susan Kaiser DOB: May 22, 1995 MRN: [id number]  Date of admission: 01/23/2021 Delivery date:01/24/2021  Delivering provider: PRAY, Joycelyn Schmid E  Date of discharge: 01/26/2021  Admitting diagnosis: Encounter for induction of labor [Z34.90] Intrauterine pregnancy: [redacted]w[redacted]d    Secondary diagnosis:  Principal Problem:   Encounter for induction of labor Active Problems:   Obesity affecting pregnancy, antepartum   Supervision of high-risk pregnancy   Chronic benign essential hypertension, antepartum   Pre-existing diabetes mellitus affecting pregnancy, antepartum   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 ([place]   Recurrent major depressive disorder ([place]   Type 2 diabetes mellitus without complication, without long-term current use of insulin ([place]   Vaginal delivery  Additional problems: None    Discharge diagnosis: Term Pregnancy Delivered, CHTN, and Type 2 DM                                              Post partum procedures: None Augmentation: AROM, Pitocin, and Cytotec Complications: None  Hospital course: Induction of Labor With Vaginal Delivery   25y.o. yo G2P1001 at 326w1das admitted to the hospital 01/23/2021 for induction of labor.  Indication for induction: TYPE 2 DM and Chronic HTN .  Patient had an uncomplicated labor course as follows: Membrane Rupture Time/Date: 6:54 AM ,01/24/2021   Delivery Method:Vaginal, Spontaneous  Episiotomy: None  Lacerations:  None  Details of delivery can be found in separate delivery note.  Patient had a routine postpartum course and is meeting all milestones. Her blood pressures remained controlled. She will start a short course of oral Lasix upon discharge and will have a BP check in week. She had a fasting glucose drawn inpatient; however, it was noted that she drank Gatorade shortly prior. Her CBG was 108. Discussed follow up testing at postpartum visit to determine whether any  additional treatment is needed at that time. Medications to bedside and babyscripts ordered. Patient will have a mood check in one week. All questions and concerns addressed. Patient is discharged home 01/26/21.  Newborn Data: Birth date:01/24/2021  Birth time:11:51 AM  Gender:Female  Living status:Living  Apgars:7 ,9  Weight:3240 g   Magnesium Sulfate received: No BMZ received: No Rhophylac: N/A MMR: N/A T-DaP: Given prenatally Flu: Given prenatally Transfusion: No  Physical exam  Vitals:   01/25/21 0320 01/25/21 1415 01/25/21 2042 01/26/21 0624  BP: 131/60 118/68 128/81 126/76  Pulse: 80 76 69 66  Resp: 16 18 17 16   Temp: 98.2 F (36.8 C) 98.4 F (36.9 C) 98.6 F (37 C) 98.1 F (36.7 C)  TempSrc: Oral Oral Oral   SpO2:   100%   Weight:      Height:       General: alert, cooperative, and no distress Lochia: appropriate Uterine Fundus: firm and below umbilicus, nontender to palpation  DVT Evaluation: no LE edema or calf tenderness to palpation   Labs: Lab Results  Component Value Date   WBC 10.4 01/25/2021   HGB 10.1 (L) 01/25/2021   HCT 30.9 (L) 01/25/2021   MCV 73.6 (L) 01/25/2021   PLT 154 01/25/2021   CMP Latest Ref Rng & Units 01/25/2021  Glucose 70 - 99 mg/dL 90  BUN 6 - 20 mg/dL <5(L)  Creatinine 0.44 - 1.00 mg/dL 0.78  Sodium 135 - 145 mmol/L 137  Potassium 3.5 - 5.1 mmol/L 3.9  Chloride 98 - 111 mmol/L 105  CO2 22 - 32 mmol/L 23  Calcium 8.9 - 10.3 mg/dL 8.6(L)  Total Protein 6.5 - 8.1 g/dL 5.4(L)  Total Bilirubin 0.3 - 1.2 mg/dL 0.6  Alkaline Phos 38 - 126 U/L 120  AST 15 - 41 U/L 37  ALT 0 - 44 U/L 69(H)   Flavia Shipper Score: Edinburgh Postnatal Depression Scale Screening Tool 01/24/2021  I have been able to laugh and see the funny side of things. (No Data)     After visit meds:  Allergies as of 01/26/2021       Reactions   Amoxicillin Anaphylaxis, Itching, Rash        Medication List     STOP taking these medications     Accu-Chek Guide test strip Generic drug: glucose blood   Accu-Chek Softclix Lancets lancets   aspirin EC 81 MG tablet   Diclegis 10-10 MG Tbec Generic drug: Doxylamine-Pyridoxine       TAKE these medications    acetaminophen 500 MG tablet Commonly known as: TYLENOL Take 2 tablets (1,000 mg total) by mouth every 8 (eight) hours as needed (pain).   furosemide 20 MG tablet Commonly known as: Lasix Take 1 tablet (20 mg total) by mouth daily for 5 days.   ibuprofen 800 MG tablet Commonly known as: ADVIL Take 1 tablet (800 mg total) by mouth every 8 (eight) hours as needed.   prenatal vitamin w/FE, FA 29-1 MG Chew chewable tablet Chew 1 tablet by mouth daily at 12 noon.   sertraline 100 MG tablet Commonly known as: Zoloft Take 1 tablet (100 mg total) by mouth daily.         Discharge home in stable condition Infant Feeding: Bottle Infant Disposition:home with mother Discharge instruction: per After Visit Summary and Postpartum booklet. Activity: Advance as tolerated. Pelvic rest for 6 weeks.  Diet: routine diet Future Appointments: Future Appointments  Date Time Provider [place]  02/04/2021 10:35 AM Truett Mainland, DO [facility] None  02/15/2021  2:15 PM [facility]  03/03/2021  1:10 PM Truett Mainland, DO [facility] None   Follow up Visit:  [facility] [place] Follow up.   Specialty: Obstetrics and Gynecology Contact information: [address] [place] [address] [phone]               Message sent to schedulers on 01/24/21 by Dr Thompson Grayer.  Please schedule this patient for a In person postpartum visit in 6 weeks with the following provider: MD. Additional Postpartum F/U: Postpartum Depression checkup and BP check 1 week, plan for A1c at postpartum visit.  High risk pregnancy complicated by: HTN and V7KQ Delivery  mode:  Vaginal, Spontaneous  Anticipated Birth Control:  Unsure, planning to decide at postpartum visit. Counseled prior to discharge.  01/26/2021 Genia Del, MD

## 2021-01-24 NOTE — Anesthesia Procedure Notes (Signed)
Epidural Patient location during procedure: OB Start time: 01/24/2021 4:16 AM End time: 01/24/2021 4:22 AM  Staffing Anesthesiologist: Bethena Midget, MD  Preanesthetic Checklist Completed: patient identified, IV checked, site marked, risks and benefits discussed, surgical consent, monitors and equipment checked, pre-op evaluation and timeout performed  Epidural Patient position: sitting Prep: DuraPrep and site prepped and draped Patient monitoring: continuous pulse ox and blood pressure Approach: midline Location: L2-L3 Injection technique: LOR air  Needle:  Needle type: Tuohy  Needle gauge: 17 G Needle length: 9 cm and 9 Needle insertion depth: 9 cm Catheter type: closed end flexible Catheter size: 19 Gauge Catheter at skin depth: 15 cm Test dose: negative  Assessment Events: blood not aspirated, injection not painful, no injection resistance, no paresthesia and negative IV test

## 2021-01-24 NOTE — Progress Notes (Signed)
Patient ID: Susan Kaiser, female   DOB: April 08, 1995, 25 y.o.   MRN: [id number]   Susan Kaiser is a 25 y.o. G2P1001 at [redacted]w[redacted]d  admitted for induction of labor due to [facility] and Type2.  Subjective:  Rested all night; no complaints at this time.  Objective: Vitals:   01/24/21 0446 01/24/21 0502 01/24/21 0531 01/24/21 0632  BP: 134/65 (!) 120/45 126/68 120/63  Pulse: 91 (!) 148 84 89  Resp:      Temp:      TempSrc:      SpO2:      Weight:      Height:       No intake/output data recorded.  FHT:  FHR: 140 bpm, variability: moderate,  accelerations:  Present,  decelerations:  Absent UC:   irregular, every 2-6 minutes SVE:   Dilation: 4.5 Effacement (%): 60 Station: -1 Exam by:: Susan Kaiser, CNM   Labs: Lab Results  Component Value Date   WBC 7.8 01/23/2021   HGB 10.7 (L) 01/23/2021   HCT 32.5 (L) 01/23/2021   MCV 74.5 (L) 01/23/2021   PLT 174 01/23/2021    Assessment / Plan:  s/p one cytotec and patient made change to 4 cm; now with AROM and IUPC placed; pitocin started.  PCR is 0.09;  VSS all night;   Labor:  protracted labor; will start pitocin now Fetal Wellbeing:  Category I Pain Control:  Epidural Anticipated MOD:  NSVD  Susan Kaiser 01/24/2021, 7:00 AM

## 2021-01-25 LAB — COMPREHENSIVE METABOLIC PANEL
ALT: 69 U/L — ABNORMAL HIGH (ref 0–44)
AST: 37 U/L (ref 15–41)
Albumin: 2.3 g/dL — ABNORMAL LOW (ref 3.5–5.0)
Alkaline Phosphatase: 120 U/L (ref 38–126)
Anion gap: 9 (ref 5–15)
BUN: <5 mg/dL — ABNORMAL LOW (ref 6–20)
CO2: 23 mmol/L (ref 22–32)
Calcium: 8.6 mg/dL — ABNORMAL LOW (ref 8.9–10.3)
Chloride: 105 mmol/L (ref 98–111)
Creatinine, Ser: 0.78 mg/dL (ref 0.44–1.00)
GFR, Estimated: >60 mL/min (ref >60)
Glucose, Bld: 90 mg/dL (ref 70–99)
Potassium: 3.9 mmol/L (ref 3.5–5.1)
Sodium: 137 mmol/L (ref 135–145)
Total Bilirubin: 0.6 mg/dL (ref 0.3–1.2)
Total Protein: 5.4 g/dL — ABNORMAL LOW (ref 6.5–8.1)

## 2021-01-25 LAB — CBC
HCT: 30.9 % — ABNORMAL LOW (ref 36.0–46.0)
Hemoglobin: 10.1 g/dL — ABNORMAL LOW (ref 12.0–15.0)
MCH: 24 pg — ABNORMAL LOW (ref 26.0–34.0)
MCHC: 32.7 g/dL (ref 30.0–36.0)
MCV: 73.6 fL — ABNORMAL LOW (ref 80.0–100.0)
Platelets: 154 10*3/uL (ref 150–400)
RBC: 4.2 MIL/uL (ref 3.87–5.11)
RDW: 15.6 % — ABNORMAL HIGH (ref 11.5–15.5)
WBC: 10.4 10*3/uL (ref 4.0–10.5)
nRBC: 0 % (ref 0.0–0.2)

## 2021-01-25 LAB — GLUCOSE, CAPILLARY: Glucose-Capillary: 108 mg/dL — ABNORMAL HIGH (ref 70–99)

## 2021-01-25 MED ORDER — IBUPROFEN 600 MG PO TABS
600.0000 mg | ORAL_TABLET | Freq: Four times a day (QID) | ORAL | Status: DC
  Administered 2021-01-25 – 2021-01-26 (×4): 600 mg via ORAL
  Filled 2021-01-25 (×4): qty 1

## 2021-01-25 MED ORDER — KETOROLAC TROMETHAMINE 30 MG/ML IJ SOLN
30.0000 mg | Freq: Once | INTRAMUSCULAR | Status: AC
  Administered 2021-01-25: 06:00:00 30 mg via INTRAVENOUS
  Filled 2021-01-25: qty 1

## 2021-01-25 NOTE — Anesthesia Postprocedure Evaluation (Signed)
Anesthesia Post Note  Patient: Susan Kaiser  Procedure(s) Performed: AN AD HOC LABOR EPIDURAL     Patient location during evaluation: Mother Baby Anesthesia Type: Epidural Level of consciousness: awake and alert and oriented Pain management: satisfactory to patient Vital Signs Assessment: post-procedure vital signs reviewed and stable Respiratory status: respiratory function stable Cardiovascular status: stable Postop Assessment: no headache, no backache, epidural receding, patient able to bend at knees, no signs of nausea or vomiting, adequate PO intake and able to ambulate Anesthetic complications: no   No notable events documented.  Last Vitals:  Vitals:   01/24/21 2316 01/25/21 0320  BP: 131/81 131/60  Pulse: 81 80  Resp: 18 16  Temp: 36.7 C 36.8 C  SpO2:      Last Pain:  Vitals:   01/25/21 0847  TempSrc:   PainSc: Asleep   Pain Goal:                   Karleen Dolphin

## 2021-01-25 NOTE — Progress Notes (Signed)
POSTPARTUM PROGRESS NOTE  Subjective: SHARRYN BELDING is a 25 y.o. [id number] PPD#1 s/p SVD at [redacted]w[redacted]d.  She reports she doing well. No acute events overnight. She denies any problems with ambulating, voiding or po intake. Denies nausea or vomiting. She has  passed flatus. Pain is poorly controlled.  Lochia is scant.  Objective: Blood pressure 131/60, pulse 80, temperature 98.2 F (36.8 C), temperature source Oral, resp. rate 16, height 5\' 10"  (1.778 m), weight (!) 181.9 kg, last menstrual period 04/25/2020, SpO2 98 %, unknown if currently breastfeeding.  Physical Exam:  General: alert, cooperative and no distress Chest: no respiratory distress Abdomen: soft, non-tender  Uterine Fundus: firm, appropriately tender Extremities: No calf swelling or tenderness   trace edema  Recent Labs    01/23/21 1615 01/25/21 0521  HGB 10.7* 10.1*  HCT 32.5* 30.9*    Assessment/Plan: SAPNA PADRON is a 25 y.o. 22 s/p SVD at [redacted]w[redacted]d after IOL for cHTN and T2DM.  Routine Postpartum Care: Having breakthrough pain (uterine cramping) with ibuprofen and tylenol. Offered dose of IM Toradol and patient would like to try. Also offered PRN Oxycodone. Patient would like to hold off if possible on Oxycodone. -- Continue routine care, lactation support  -- Contraception: still deciding -- Feeding: formula  #T2DM Patient not on medications in prenatal period. FBG pending. Will follow up.  #Elevated liver enzymes ALT treng 66>81, AST 30s>49. Repeat drawn. Will follow up.  #cHTN Not on medications. Had two BP in >130. Does not meet our practice criteria yet for starting Procardia. Will monitor today since patient staying  Dispo: Patient would like to stay another day since pain not well controlled. Plan for discharge PPD#2.  [redacted]w[redacted]d, MD, MPH OB Fellow, [facility] for [facility]

## 2021-01-25 NOTE — Progress Notes (Signed)
Fasting CBG 108. Pt states she had drank some Gatorade.

## 2021-01-25 NOTE — Social Work (Addendum)
[name] was referred for history of anxiety/depression.  * Referral screened out by Clinical Social Worker because none of the following criteria appear to apply: ~ History of anxiety/depression during this pregnancy, or of post-partum depression following prior delivery. ~ Diagnosis of anxiety and/or depression within last 3 years OR * [name]'s symptoms currently being treated with medication and/or therapy. Per chart review, [name] treats symptoms with Zoloft.   Please contact the Clinical Social Worker if needs arise, by [facility] request, or if [name] scores greater than 9/yes to question 10 on Edinburgh Postpartum Depression Screen.   Susan Kaiser, MSW, LCSW [facility]  Clinical Social Worker  [phone] 01/25/2021  8:42 AM

## 2021-01-26 ENCOUNTER — Other Ambulatory Visit (HOSPITAL_COMMUNITY): Payer: Self-pay

## 2021-01-26 MED ORDER — IBUPROFEN 800 MG PO TABS
800.0000 mg | ORAL_TABLET | Freq: Three times a day (TID) | ORAL | 0 refills | Status: DC | PRN
  Filled 2021-01-26: qty 30, 10d supply, fill #0

## 2021-01-26 MED ORDER — ACETAMINOPHEN 500 MG PO TABS
1000.0000 mg | ORAL_TABLET | Freq: Three times a day (TID) | ORAL | 0 refills | Status: DC | PRN
  Filled 2021-01-26: qty 60, 10d supply, fill #0

## 2021-01-26 MED ORDER — FUROSEMIDE 20 MG PO TABS
20.0000 mg | ORAL_TABLET | Freq: Every day | ORAL | 0 refills | Status: DC
  Filled 2021-01-26: qty 5, 5d supply, fill #0

## 2021-01-26 NOTE — Social Work (Signed)
CSW received consult for Edinburgh 10.  CSW met with [name] to offer support and complete assessment.   ° °CSW met with [name] at bedside and introduced CSW role. CSW observed [name] sitting up in the chair holding the infant prepared to be discharged. [name] presented calm and receptive to CSW visit. CSW inquired how [name] has felt since giving birth. [name] reported feeling good with a good L&D experience. CSW inquired how [name] felt during the pregnancy. [name] disclosed that she experienced a lot of depression and anxiety symptoms. CSW discussed [name] edinburgh. [name] shared she is anxious all the time because she has generalized anxiety disorder. 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 [name] reported she was diagnosed with depression and anxiety in 2018. She treats her symptoms by taking Zoloft and sees therapist "Jamie" for counseling. [name] reported the counseling and medication are helpful and her next session with Jamie is Jan. 10. [name] shared comfort in her supports and acknowledged her significant other, parents and siblings as her supports. CSW inquired about [name] coping strategies. [name] shared that she likes to take time out for herself and does it as often as she needs to. CSW encouraged [name] to continue implementing those coping strategies. CSW provided education regarding the baby blues period vs. perinatal mood disorders, discussed treatment and gave resources for mental health follow up. CSW recommended [name] complete a self-evaluation during the postpartum time period using the New Mom Checklist from Postpartum Progress and encouraged [name] to contact a medical professional if symptoms are noted at any time. [name] reported she feels comfortable reaching out to "Dr. Jacob" if concerns arise.  [name] denied SI/HI/DV. ° ° °[name] reported she has essential items for the infant including a bassinet where the infant will sleep. CSW provided review of Sudden  Infant Death Syndrome (SIDS) precautions. [name] has chosen [facility] for the infant's follow up care. CSW assessed [name] for additional need. [name] reported no further need.  °  °CSW identifies no further need for intervention and no barriers to discharge at this time.  ° °Susan Kaiser, MSW, LCSW °[facility]  °Clinical Social Worker  °[phone] °01/26/2021  1:39 PM  °

## 2021-01-27 ENCOUNTER — Encounter: Payer: Managed Care, Other (non HMO) | Admitting: Family Medicine

## 2021-02-01 NOTE — BH Specialist Note (Deleted)
Integrated Behavioral Health via Telemedicine Visit  02/01/2021 Susan Kaiser [id number]  Number of Integrated Behavioral Health visits: *** Session Start time: 2:15***  Session End time: 2:45*** Total time: {IBH Total LEXN:17001749}  Referring Provider: *** Patient/Family location: Home*** [facility]: [facility]  All persons participating in visit: Patient *** and [facility] Susan Kaiser ***  Types of Service: {CHL AMB TYPE OF [phone]}  I connected with Susan Kaiser and/or Susan Kaiser's {family members:20773} via  Telephone or Engineer, civil (consulting)  (Video is Surveyor, mining) and verified that I am speaking with the correct person using two identifiers. Discussed confidentiality: {YES/NO:21197}  I discussed the limitations of telemedicine and the availability of in person appointments.  Discussed there is a possibility of technology failure and discussed alternative modes of communication if that failure occurs.  I discussed that engaging in this telemedicine visit, they consent to the provision of behavioral healthcare and the services will be billed under their insurance.  Patient and/or legal guardian expressed understanding and consented to Telemedicine visit: {YES/NO:21197}  Presenting Concerns: Patient and/or family reports the following symptoms/concerns: *** Duration of problem: ***; Severity of problem: {Mild/Moderate/Severe:20260}  Patient and/or Family's Strengths/Protective Factors: {CHL AMB BH PROTECTIVE [phone]}  Goals Addressed: Patient will:  Reduce symptoms of: {IBH Symptoms:21014056}   Increase knowledge and/or ability of: {IBH Patient Tools:21014057}   Demonstrate ability to: {IBH Goals:21014053}  Progress towards Goals: {CHL AMB BH PROGRESS TOWARDS [phone]}  Interventions: Interventions utilized:  {IBH  Interventions:21014054} Standardized Assessments completed: {IBH Screening Tools:21014051}  Patient and/or Family Response: ***  Assessment: Patient currently experiencing ***.   Patient may benefit from ***.  Plan: Follow up with behavioral health clinician on : *** Behavioral recommendations: *** Referral(s): {IBH Referrals:21014055}  I discussed the assessment and treatment plan with the patient and/or parent/guardian. They were provided an opportunity to ask questions and all were answered. They agreed with the plan and demonstrated an understanding of the instructions.   They were advised to call back or seek an in-person evaluation if the symptoms worsen or if the condition fails to improve as anticipated.  Susan Close Rock Sobol, LCSW

## 2021-02-02 ENCOUNTER — Telehealth (HOSPITAL_COMMUNITY): Payer: Self-pay | Admitting: *Deleted

## 2021-02-02 NOTE — Telephone Encounter (Signed)
Hospital EPDS=10. SW saw mom as inpatient. Patient is receiving treatment and has appt with [facility] on 02-15-2021.  Duffy Rhody, RN 02-02-2021 at 3:24pm

## 2021-02-03 ENCOUNTER — Other Ambulatory Visit: Payer: Self-pay

## 2021-02-03 ENCOUNTER — Inpatient Hospital Stay (HOSPITAL_COMMUNITY)
Admission: AD | Admit: 2021-02-03 | Discharge: 2021-02-03 | Disposition: A | Discharge status: Home / Self Care | Payer: Managed Care, Other (non HMO) | Attending: Obstetrics & Gynecology | Admitting: Obstetrics & Gynecology

## 2021-02-03 ENCOUNTER — Encounter: Payer: Self-pay | Admitting: Family Medicine

## 2021-02-03 ENCOUNTER — Encounter (HOSPITAL_COMMUNITY): Payer: Self-pay | Admitting: Obstetrics & Gynecology

## 2021-02-03 ENCOUNTER — Inpatient Hospital Stay (HOSPITAL_COMMUNITY): Payer: Managed Care, Other (non HMO)

## 2021-02-03 ENCOUNTER — Telehealth: Payer: Self-pay

## 2021-02-03 DIAGNOSIS — N939 Abnormal uterine and vaginal bleeding, unspecified: Secondary | ICD-10-CM | POA: Diagnosis not present

## 2021-02-03 DIAGNOSIS — I10 Essential (primary) hypertension: Secondary | ICD-10-CM

## 2021-02-03 DIAGNOSIS — O9089 Other complications of the puerperium, not elsewhere classified: Secondary | ICD-10-CM | POA: Insufficient documentation

## 2021-02-03 DIAGNOSIS — O1093 Unspecified pre-existing hypertension complicating the puerperium: Secondary | ICD-10-CM | POA: Insufficient documentation

## 2021-02-03 DIAGNOSIS — R109 Unspecified abdominal pain: Secondary | ICD-10-CM | POA: Diagnosis present

## 2021-02-03 DIAGNOSIS — O2413 Pre-existing diabetes mellitus, type 2, in the puerperium: Secondary | ICD-10-CM | POA: Diagnosis not present

## 2021-02-03 LAB — CBC
HCT: 30.5 % — ABNORMAL LOW (ref 36.0–46.0)
Hemoglobin: 10 g/dL — ABNORMAL LOW (ref 12.0–15.0)
MCH: 24.3 pg — ABNORMAL LOW (ref 26.0–34.0)
MCHC: 32.8 g/dL (ref 30.0–36.0)
MCV: 74 fL — ABNORMAL LOW (ref 80.0–100.0)
Platelets: 189 10*3/uL (ref 150–400)
RBC: 4.12 MIL/uL (ref 3.87–5.11)
RDW: 15.9 % — ABNORMAL HIGH (ref 11.5–15.5)
WBC: 8.4 10*3/uL (ref 4.0–10.5)
nRBC: 0 % (ref 0.0–0.2)

## 2021-02-03 LAB — COMPREHENSIVE METABOLIC PANEL
ALT: 17 U/L (ref 0–44)
AST: 6 U/L — ABNORMAL LOW (ref 15–41)
Albumin: 2.6 g/dL — ABNORMAL LOW (ref 3.5–5.0)
Alkaline Phosphatase: 59 U/L (ref 38–126)
Anion gap: 11 (ref 5–15)
BUN: 10 mg/dL (ref 6–20)
CO2: 24 mmol/L (ref 22–32)
Calcium: 8.6 mg/dL — ABNORMAL LOW (ref 8.9–10.3)
Chloride: 105 mmol/L (ref 98–111)
Creatinine, Ser: 0.9 mg/dL (ref 0.44–1.00)
GFR, Estimated: >60 mL/min (ref >60)
Glucose, Bld: 78 mg/dL (ref 70–99)
Potassium: 4 mmol/L (ref 3.5–5.1)
Sodium: 140 mmol/L (ref 135–145)
Total Bilirubin: 0.4 mg/dL (ref 0.3–1.2)
Total Protein: 6.5 g/dL (ref 6.5–8.1)

## 2021-02-03 MED ORDER — NIFEDIPINE ER OSMOTIC RELEASE 30 MG PO TB24
30.0000 mg | ORAL_TABLET | Freq: Every day | ORAL | Status: DC
  Administered 2021-02-03: 18:00:00 30 mg via ORAL
  Filled 2021-02-03: qty 1

## 2021-02-03 MED ORDER — LABETALOL HCL 5 MG/ML IV SOLN: 20.0000 mg | INTRAVENOUS | Status: DC | PRN

## 2021-02-03 MED ORDER — LABETALOL HCL 5 MG/ML IV SOLN: 80.0000 mg | INTRAVENOUS | Status: DC | PRN

## 2021-02-03 MED ORDER — HYDRALAZINE HCL 20 MG/ML IJ SOLN: 10.0000 mg | INTRAMUSCULAR | Status: DC | PRN

## 2021-02-03 MED ORDER — LABETALOL HCL 5 MG/ML IV SOLN: 40.0000 mg | INTRAVENOUS | Status: DC | PRN

## 2021-02-03 MED ORDER — NIFEDIPINE ER OSMOTIC RELEASE 30 MG PO TB24: 30.0000 mg | ORAL_TABLET | Freq: Every day | ORAL | 1 refills | Status: AC

## 2021-02-03 NOTE — MAU Note (Signed)
PP- Vag delivery 12/19. Pt reports she has had increased abd pain and passing clots  for the past few days. Pain was worse yesterday too Ibuprofen and tylenol did not help much yesterday. Pain less today and has not taken anymore tylenol or ibuprofen today. Changing pad every 20 min this morning. Now had changed it 3-4 times in the past 6 hours. Still passing clots golfball size .

## 2021-02-03 NOTE — Telephone Encounter (Signed)
Pt sent Mychart message stating she has been having some heavy bleeding and passing clots this week. Advised pt to go to [facility] to be evaluated. Understanding was voiced. Ovella Manygoats l Merida Alcantar, CMA

## 2021-02-03 NOTE — MAU Provider Note (Signed)
History     CSN: [id number]  Arrival date and time: 02/03/21 1606  None    Chief Complaint  Patient presents with   Abdominal Pain   Vaginal Bleeding   HPI Susan Kaiser is a 25 y.o. G2P2 s/p SVD on 01/24/2021 who presents to MAU for vaginal bleeding. Pregnancy was complicated by [facility] and T2DM. Patient is not on medications for either. Was sent home on Lasix, completed course. Patient reports on Monday or Tuesday she started having severe abdominal pain that was so painful that it was hard to walk. She reports bleeding had initially stopped, however started having some bleeding today and passed a small blood clot. She reports later on she passed another clot that was the size of a golf ball. Has changed her pad 3-4 times in the past 6 hours. She denies fever, dizziness/passing out. No headaches, vision changes, or RUQ/epigastric pain. She took Tylenol/Ibuprofen yesterday for pain which did not help, however is not having pain today. She is formula feeding her baby. Has appointment scheduled tomorrow at [facility].  OB History     Gravida  2   Para  2   Term  2   Preterm      AB      Living  2      SAB      IAB      Ectopic      Multiple  0   Live Births  2           Past Medical History:  Diagnosis Date   Depression    Diabetes mellitus without complication (HCC)    Dysmenorrhea 09/17/2014   Generalized anxiety disorder    Menorrhagia 04/21/2013   Menstrual extraction 05/26/2013   Obesity    PCO (polycystic ovaries) 06/10/2013   Unspecified symptom associated with female genital organs 06/10/2013    Past Surgical History:  Procedure Laterality Date   EXTERNAL EAR SURGERY     stuck a corn kernnel in her ear when she was 4     Family History  Problem Relation Age of Onset   Diabetes Paternal Aunt    Diabetes Paternal Uncle    Diabetes Maternal Grandmother    Diabetes Maternal Grandfather    Other Mother        cysts on ovaries   Heart disease Father      Social History   Tobacco Use   Smoking status: Never   Smokeless tobacco: Never  Vaping Use   Vaping Use: Never used  Substance Use Topics   Alcohol use: No   Drug use: Not Currently    Allergies:  Allergies  Allergen Reactions   Amoxicillin Anaphylaxis, Itching and Rash    No medications prior to admission.    Review of Systems  Constitutional: Negative.   Respiratory: Negative.    Cardiovascular: Negative.   Gastrointestinal:  Positive for abdominal pain.  Genitourinary:  Positive for vaginal bleeding.  Musculoskeletal: Negative.   Neurological: Negative.    Physical Exam   Patient Vitals for the past 24 hrs:  BP Temp Pulse Resp SpO2 Height Weight  02/03/21 1909 (!) 152/98 -- 93 -- -- -- --  02/03/21 1822 137/75 -- 89 -- -- -- --  02/03/21 1730 (!) 156/89 -- 85 -- -- -- --  02/03/21 1727 (!) 160/93 -- 88 -- -- -- --  02/03/21 1725 -- -- -- -- 100 % -- --  02/03/21 1715 -- -- -- -- 100 % -- --  02/03/21 1710 -- -- -- -- 100 % -- --  02/03/21 1705 -- -- -- -- 100 % -- --  02/03/21 1700 -- -- -- -- 100 % -- --  02/03/21 1655 -- -- -- -- 100 % -- --  02/03/21 1650 -- -- -- -- 100 % -- --  02/03/21 1647 (!) 151/88 -- 90 -- -- -- --  02/03/21 1632 (!) 152/89 98.6 F (37 C) 92 18 -- 5\' 10"  (1.778 m) (!) 177.4 kg   Physical Exam Vitals and nursing note reviewed. Exam conducted with a chaperone present.  Constitutional:      General: She is not in acute distress.    Appearance: She is obese.  Cardiovascular:     Rate and Rhythm: Normal rate.  Pulmonary:     Effort: Pulmonary effort is normal.  Abdominal:     Palpations: Abdomen is soft.     Tenderness: There is no abdominal tenderness.  Genitourinary:    Comments: NEFG, vaginal wall pink with rugae, small amount of dark red blood pooling, no active vaginal bleeding, cervix appears visually closed, without lesions/masses, no CMT  Skin:    General: Skin is warm and dry.  Neurological:     General: No  focal deficit present.     Mental Status: She is alert and oriented to person, place, and time.  Psychiatric:        Mood and Affect: Mood normal.        Behavior: Behavior normal.   PELVIS (TRANSABDOMINAL ONLY)  Result Date: 02/03/2021 CLINICAL DATA:  Postpartum bleeding. Spontaneous vaginal delivery on 01/24/2021 EXAM: TRANSABDOMINAL ULTRASOUND OF PELVIS TECHNIQUE: Transabdominal ultrasound examination of the pelvis was performed including evaluation of the uterus, ovaries, adnexal regions, and pelvic cul-de-sac. COMPARISON:  01/13/2021 FINDINGS: Uterus Measurements: 14.5 x 10.8 x 9.1 cm = volume: 745 mL. Uterus is diffusely enlarged consistent with postpartum state. No myometrial mass lesions identified. Endometrium Thickness: 4 mm.  No focal abnormality visualized. Right ovary Measurements: 2.9 x 1.8 x 2.3 cm = volume: 6.2 mL. Normal appearance/no adnexal mass. Left ovary Measurements: 3.8 x 2.1 x 3 cm = volume: 12.8 mL. Normal appearance/no adnexal mass. Other findings:  No abnormal free fluid. IMPRESSION: Diffuse uterine enlargement consistent with recent postpartum state. No myometrial mass lesion. Endometrial stripe is normal. Both ovaries are normal. Electronically Signed   By: 14/09/2020 M.D.   On: 02/03/2021 18:32    MAU Course  Procedures Serial BP's CBC, CMP Pelvic 02/05/2021 Procardia 30xl PO  MDM Labs reassuring, hgb stable. CMP unremarkable. Bleeding minimal on speculum exam. US unremarkable. BP's mild range, although one severe BP. Repeat mild range. Given BP's consistently 150s/90s. Procardia 30XL given. Patient remains asymptomatic during entire visit. Will rx Procardia 30XL.   Assessment and Plan  Postpartum vaginal bleeding Chronic hypertension  - Discharge home in stable condition - Procardia 30XL sent to pharmacy - Strict pre-e and return precautions reviewed with patient - Patient to keep appointment as scheduled tomorrow at [facility]   [place],  CNM 02/03/2021, 7:16 PM

## 2021-02-04 ENCOUNTER — Telehealth (HOSPITAL_COMMUNITY): Payer: Self-pay

## 2021-02-04 ENCOUNTER — Ambulatory Visit: Payer: Managed Care, Other (non HMO) | Admitting: Family Medicine

## 2021-02-04 NOTE — Telephone Encounter (Signed)
"  I've had a headache since last night. Everytime I get up with the baby my head is just pounding. I haven't tried taking anything for it. I'm about to take some tylenol now." RN reviewed preeclampsia and symptoms of preeclampsia. Patient declines other symptoms. RN asks if she has a way to check her BP at home. Pt states "I do but I have to find it". RN told patient to call her OB and speak with them about her pounding headache since last night. Patient has no other concerns or questions about her healing.  "She's good, she's doing great. Eating and gaining weight. She sleeps in a bassinet." RN reviewed ABC's of safe sleep with patient. Patient declines any questions or concerns about baby.  EPDS score is 2.  Marcelino Duster [facility] 02/04/2021,1044

## 2021-02-09 ENCOUNTER — Encounter: Payer: Self-pay | Admitting: Family Medicine

## 2021-02-09 ENCOUNTER — Other Ambulatory Visit: Payer: Self-pay

## 2021-02-09 ENCOUNTER — Ambulatory Visit (INDEPENDENT_AMBULATORY_CARE_PROVIDER_SITE_OTHER): Payer: Managed Care, Other (non HMO) | Admitting: Family Medicine

## 2021-02-09 DIAGNOSIS — I10 Essential (primary) hypertension: Secondary | ICD-10-CM

## 2021-02-09 DIAGNOSIS — F411 Generalized anxiety disorder: Secondary | ICD-10-CM

## 2021-02-09 NOTE — Progress Notes (Signed)
° °  Subjective:    Patient ID: Susan Kaiser, female    DOB: [birth year], 26 y.o.   MRN: [id number]  HPI Patient seen for blood pressure check and mood check.  She reports no problems.  Did have increased anxiety on the first few days of returning home, but feels much better now.   Edinburgh Postnatal Depression Scale - 02/09/21 1601       Edinburgh Postnatal Depression Scale:  In the Past 7 Days   I have been able to laugh and see the funny side of things. 0    I have looked forward with enjoyment to things. 0    I have blamed myself unnecessarily when things went wrong. 0    I have been anxious or worried for no good reason. 1    I have felt scared or panicky for no good reason. 1    Things have been getting on top of me. 0    I have been so unhappy that I have had difficulty sleeping. 0    I have felt sad or miserable. 0    I have been so unhappy that I have been crying. 0    The thought of harming myself has occurred to me. 0    Edinburgh Postnatal Depression Scale Total 2              Review of Systems  BP 119/71    Pulse 100    Wt (!) 392 lb (177.8 kg)    BMI 56.25 kg/m      Objective:   Physical Exam Vitals reviewed.  Constitutional:      Appearance: Normal appearance.  Cardiovascular:     Rate and Rhythm: Normal rate and regular rhythm.  Pulmonary:     Effort: Pulmonary effort is normal.     Breath sounds: Normal breath sounds.  Skin:    Capillary Refill: Capillary refill takes less than 2 seconds.  Neurological:     General: No focal deficit present.     Mental Status: She is alert.  Psychiatric:        Mood and Affect: Mood normal.        Behavior: Behavior normal.        Thought Content: Thought content normal.        Judgment: Judgment normal.       Assessment & Plan:   1. Vaginal delivery   2. Chronic hypertension   3. GAD (generalized anxiety disorder)    Patient doing well. Blood pressure normal [place] score 2. Patient has postpartum  visit scheduled.  Patient to contact us with any changes.

## 2021-02-15 ENCOUNTER — Encounter: Payer: Medicaid Other | Admitting: Clinical

## 2021-02-17 NOTE — BH Specialist Note (Signed)
Integrated Behavioral Health via Telemedicine Visit  02/17/2021 Susan Kaiser [id number]  Number of Integrated Behavioral Health visits: 2 Session Start time: 1:46  Session End time: 2:16 Total time: 30  Referring Provider: Wynelle Bourgeois, CNM Patient/Family location: Home [facility] Provider location: [facility]  All persons participating in visit:Patient Susan Kaiser and Susan Kaiser   Types of Service: Individual psychotherapy and Video visit  I connected with Susan Kaiser and/or Susan Kaiser  n/a  via  Telephone or Video Enabled Telemedicine Application  (Video is Caregility application) and verified that I am speaking with the correct person using two identifiers. Discussed confidentiality: Yes   I discussed the limitations of telemedicine and the availability of in person appointments.  Discussed there is a possibility of technology failure and discussed alternative modes of communication if that failure occurs.  I discussed that engaging in this telemedicine visit, they consent to the provision of behavioral healthcare and the services will be billed under their insurance.  Patient and/or legal guardian expressed understanding and consented to Telemedicine visit: Yes   Presenting Concerns: Patient and/or family reports the following symptoms/concerns: Feeling more isolated than expected after move to new home, lack of quality sleep, worry, trust issues, and financial stress being out of work; pt looks forward to going back to work tomorrow for social interaction and to be able to work towards her goal of repairing her car/ability to drive again; next goal is to go back to school.  Duration of problem: Increase postpartum; Severity of problem: moderate  Patient and/or Family's Strengths/Protective Factors: Concrete supports in place (healthy food, safe environments, etc.), Sense of purpose, and Physical Health  (exercise, healthy diet, medication compliance, etc.)  Goals Addressed: Patient will:  Reduce symptoms of: anxiety, depression, and stress   Increase knowledge and/or ability of: stress reduction   Demonstrate ability to: Increase healthy adjustment to current life circumstances and Increase adequate support systems for patient/family  Progress towards Goals: Ongoing  Interventions: Interventions utilized:  Solution-Focused Strategies and Link to [employer] Standardized Assessments completed: GAD-7 and PHQ 9  Patient and/or Family Response: Pt agrees with treatment plan  Assessment: Patient currently experiencing Generalized anxiety disorder.   Patient may benefit from continued brief therapeutic intervention today.  Plan: Follow up with behavioral health clinician on : Call [name] at [phone], as needed. 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 They were provided an opportunity to ask questions and all were answered. They agreed with the plan and demonstrated an understanding of the instructions.   They were advised to call back or seek an in-person evaluation if the symptoms worsen or if the condition fails to improve as anticipated.  [employer], LCSW  Depression screen [facility] 2/9 02/23/2021 01/11/2021 10/21/2020 07/09/2020  Decreased Interest 0 3 2 3   Down, Depressed, Hopeless 1 3 2 2   PHQ - 2 Score 1 6 4 5   Altered sleeping 1 3 3 3   Tired, decreased energy 1 3 2  3   Change in  appetite 0 3 2 3   Feeling bad or failure about yourself  0 3 2 2   Trouble concentrating 0 1 2 3   Moving slowly or fidgety/restless 0 0 0 1  Suicidal thoughts 0 0 0 0  PHQ-9 Score 3 19 15 20   Difficult doing work/chores - - - Somewhat difficult   GAD 7 : Generalized Anxiety Score 02/23/2021 01/11/2021 10/21/2020 07/09/2020  Nervous, Anxious, on Edge 1 3 3 3   Control/stop worrying 1 3 2 3   Worry too much - different things 1 3 2 3   Trouble relaxing 1 3 2 3   Restless 0 0 0 1  Easily annoyed or irritable 0 3 2 3   Afraid - awful might happen 0 2 1 2   Total GAD 7 Score 4 17 12  18

## 2021-02-22 ENCOUNTER — Encounter: Payer: Self-pay | Admitting: Family Medicine

## 2021-02-22 ENCOUNTER — Telehealth: Payer: Self-pay

## 2021-02-22 NOTE — Telephone Encounter (Signed)
Error. Cheney Ewart RN 

## 2021-02-23 ENCOUNTER — Ambulatory Visit (INDEPENDENT_AMBULATORY_CARE_PROVIDER_SITE_OTHER): Payer: 59 | Admitting: Clinical

## 2021-02-23 DIAGNOSIS — F411 Generalized anxiety disorder: Secondary | ICD-10-CM

## 2021-02-23 DIAGNOSIS — Z658 Other specified problems related to psychosocial circumstances: Secondary | ICD-10-CM

## 2021-02-23 NOTE — Patient Instructions (Signed)
[facility] °[address] °[place], [place] [address] °[phone] (main office) °[phone] (Sherena Machorro's office) ° ° °New mom support groups:  °[email or web address] °

## 2021-03-03 ENCOUNTER — Ambulatory Visit: Payer: Managed Care, Other (non HMO) | Admitting: Family Medicine

## 2021-04-19 NOTE — Progress Notes (Deleted)
? ?  New Patient Office Visit ? ?Subjective:  ?Patient ID: Susan Kaiser, female    DOB: [birth year]  Age: 26 y.o. MRN: [id number] ? ?CC: No chief complaint on file. ? ? ?HPI ?Susan Kaiser presents for new patient visit to establish care.  Introduced to Publishing rights manager role and practice setting.  All questions answered.  Discussed provider/patient relationship and expectations. ? ? ?Past Medical History:  ?Diagnosis Date  ? Depression   ? Diabetes mellitus without complication (HCC)   ? Dysmenorrhea 09/17/2014  ? Generalized anxiety disorder   ? Menorrhagia 04/21/2013  ? Menstrual extraction 05/26/2013  ? Obesity   ? PCO (polycystic ovaries) 06/10/2013  ? Unspecified symptom associated with female genital organs 06/10/2013  ? ? ?Past Surgical History:  ?Procedure Laterality Date  ? EXTERNAL EAR SURGERY    ? stuck a corn kernnel in her ear when she was 4   ? ? ?Family History  ?Problem Relation Age of Onset  ? Diabetes Paternal Aunt   ? Diabetes Paternal Uncle   ? Diabetes Maternal Grandmother   ? Diabetes Maternal Grandfather   ? Other Mother   ?     cysts on ovaries  ? Heart disease Father   ? ? ?Social History  ? ?Socioeconomic History  ? Marital status: Single  ?  Spouse name: Not on file  ? Number of children: Not on file  ? Years of education: Not on file  ? Highest education level: Not on file  ?Occupational History  ? Not on file  ?Tobacco Use  ? Smoking status: Never  ? Smokeless tobacco: Never  ?Vaping Use  ? Vaping Use: Never used  ?Substance and Sexual Activity  ? Alcohol use: No  ? Drug use: Not Currently  ? Sexual activity: Yes  ?  Birth control/protection: None  ?Other Topics Concern  ? Not on file  ?Social History Narrative  ? Not on file  ? ?Social Determinants of Health  ? ?Financial Resource Strain: Not on file  ?Food Insecurity: Not on file  ?Transportation Needs: Not on file  ?Physical Activity: Not on file  ?Stress: Not on file  ?Social Connections: Not on file  ?Intimate Partner Violence: Not on  file  ? ? ?ROS ?Review of Systems ? ?Objective:  ? ?Today's Vitals: There were no vitals taken for this visit. ? ?Physical Exam ? ?Assessment & Plan:  ? ?Problem List Items Addressed This Visit   ?None ? ? ?Outpatient Encounter Medications as of 04/20/2021  ?Medication Sig  ? acetaminophen (TYLENOL) 500 MG tablet Take 2 tablets (1,000 mg total) by mouth every 8 (eight) hours as needed (pain). (Patient not taking: Reported on 02/09/2021)  ? ibuprofen (ADVIL) 800 MG tablet Take 1 tablet (800 mg total) by mouth every 8 (eight) hours as needed. (Patient not taking: Reported on 02/09/2021)  ? NIFEdipine (PROCARDIA XL) 30 MG 24 hr tablet Take 1 tablet (30 mg total) by mouth daily.  ? prenatal vitamin w/FE, FA (NATACHEW) 29-1 MG CHEW chewable tablet Chew 1 tablet by mouth daily at 12 noon.  ? sertraline (ZOLOFT) 100 MG tablet Take 1 tablet (100 mg total) by mouth daily.  ? ?No facility-administered encounter medications on file as of 04/20/2021.  ? ? ?Follow-up: No follow-ups on file.  ? ?Gerre Scull, NP ? ?

## 2021-04-20 ENCOUNTER — Ambulatory Visit: Payer: Managed Care, Other (non HMO) | Admitting: Nurse Practitioner

## 2021-04-20 ENCOUNTER — Telehealth: Payer: Self-pay | Admitting: Nurse Practitioner

## 2021-04-20 NOTE — Telephone Encounter (Signed)
Pt did not show for her 04/20/21 NP app with Leotis Shames. I sent her a No Show letter. ?

## 2021-05-04 NOTE — Telephone Encounter (Signed)
1st no show, fee waived, letter sent KO   

## 2021-05-05 ENCOUNTER — Ambulatory Visit: Payer: Self-pay

## 2021-07-14 ENCOUNTER — Ambulatory Visit
Admission: RE | Admit: 2021-07-14 | Discharge: 2021-07-14 | Disposition: A | Discharge status: Home / Self Care | Payer: Managed Care, Other (non HMO) | Source: Ambulatory Visit | Attending: Emergency Medicine | Admitting: Emergency Medicine

## 2021-07-14 ENCOUNTER — Ambulatory Visit: Payer: Self-pay

## 2021-07-14 VITALS — BP 130/78 | HR 99 | Temp 98.0°F | Resp 18

## 2021-07-14 DIAGNOSIS — Z113 Encounter for screening for infections with a predominantly sexual mode of transmission: Secondary | ICD-10-CM | POA: Insufficient documentation

## 2021-07-14 DIAGNOSIS — A5602 Chlamydial vulvovaginitis: Secondary | ICD-10-CM | POA: Diagnosis not present

## 2021-07-14 DIAGNOSIS — N760 Acute vaginitis: Secondary | ICD-10-CM | POA: Diagnosis not present

## 2021-07-14 DIAGNOSIS — Z202 Contact with and (suspected) exposure to infections with a predominantly sexual mode of transmission: Secondary | ICD-10-CM | POA: Insufficient documentation

## 2021-07-14 LAB — POCT URINE PREGNANCY: Preg Test, Ur: NEGATIVE

## 2021-07-14 MED ORDER — DOXYCYCLINE HYCLATE 100 MG PO CAPS: 100.0000 mg | ORAL_CAPSULE | Freq: Two times a day (BID) | ORAL | 0 refills | Status: AC

## 2021-07-14 NOTE — ED Triage Notes (Signed)
Patient presents to Urgent Care for STD testing. Asymptomatic. Partner notified her of positive for chlamydia.

## 2021-07-14 NOTE — Discharge Instructions (Addendum)
Take doxycycline twice a day for 7 days.    Your tests are pending.  If your test results are positive, we will call you.  You and your sexual partner(s) may require treatment at that time.  Do not have sexual activity for at least 7 days.    Follow up with your primary care provider as needed.

## 2021-07-14 NOTE — ED Provider Notes (Signed)
Susan Kaiser    CSN: [id number] Arrival date & time: 07/14/21  1405      History   Chief Complaint Chief Complaint  Patient presents with   SEXUALLY TRANSMITTED DISEASE    Entered by patient    HPI Susan Kaiser is a 26 y.o. female.  Patient presents for STD testing.  She was notified by a sexual partner that she has been exposed to chlamydia.  She denies vaginal discharge, pelvic pain, abdominal pain, dysuria, fever, rash, or other symptoms.  Her medical history includes hypertension, diabetes, morbid obesity.  She denies current pregnancy or breastfeeding.     The history is provided by the patient and medical records.    Past Medical History:  Diagnosis Date   Depression    Diabetes mellitus without complication (HCC)    Dysmenorrhea 09/17/2014   Generalized anxiety disorder    Menorrhagia 04/21/2013   Menstrual extraction 05/26/2013   Obesity    PCO (polycystic ovaries) 06/10/2013   Unspecified symptom associated with female genital organs 06/10/2013    Patient Active Problem List   Diagnosis Date Noted   Vaginal delivery 01/26/2021   Encounter for induction of labor 01/23/2021   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 (HCC) 09/20/2020   Supervision of high-risk pregnancy 08/04/2020   Chronic benign essential hypertension, antepartum 08/04/2020   Pre-existing diabetes mellitus affecting pregnancy, antepartum 08/04/2020   BMI 60.0-69.9, adult (HCC) 08/04/2020   Post partum depression 06/16/2019   GAD (generalized anxiety disorder) 02/03/2016   Recurrent major depressive disorder (HCC) 02/03/2016   PCO (polycystic ovaries) 06/10/2013   Obesity affecting pregnancy, antepartum 04/21/2013   Hypertension 11/15/2011   Type 2 diabetes mellitus without complication, without long-term current use of insulin (HCC) 05/26/2011    Past Surgical History:  Procedure Laterality Date   EXTERNAL EAR SURGERY     stuck a corn kernnel in her ear when she was 4      OB History     Gravida  2   Para  2   Term  2   Preterm      AB      Living  2      SAB      IAB      Ectopic      Multiple  0   Live Births  2            Home Medications    Prior to Admission medications   Medication Sig Start Date End Date Taking? Authorizing Provider  doxycycline (VIBRAMYCIN) 100 MG capsule Take 1 capsule (100 mg total) by mouth 2 (two) times daily for 7 days. 07/14/21 07/21/21 Yes Mickie Bail, NP  acetaminophen (TYLENOL) 500 MG tablet Take 2 tablets (1,000 mg total) by mouth every 8 (eight) hours as needed (pain). Patient not taking: Reported on 02/09/2021 01/26/21   Worthy Rancher, MD  ibuprofen (ADVIL) 800 MG tablet Take 1 tablet (800 mg total) by mouth every 8 (eight) hours as needed. Patient not taking: Reported on 02/09/2021 01/26/21   Worthy Rancher, MD  NIFEdipine (PROCARDIA XL) 30 MG 24 hr tablet Take 1 tablet (30 mg total) by mouth daily. 02/03/21   Brand Males, CNM  prenatal vitamin w/FE, FA (NATACHEW) 29-1 MG CHEW chewable tablet Chew 1 tablet by mouth daily at 12 noon.    [provider]  sertraline (ZOLOFT) 100 MG tablet Take 1 tablet (100 mg total) by mouth daily. 11/12/20   Stinson,  Rhona Raider, DO    Family History Family History  Problem Relation Age of Onset   Diabetes Paternal Aunt    Diabetes Paternal Uncle    Diabetes Maternal Grandmother    Diabetes Maternal Grandfather    Other Mother        cysts on ovaries   Heart disease Father     Social History Social History   Tobacco Use   Smoking status: Never   Smokeless tobacco: Never  Vaping Use   Vaping Use: Never used  Substance Use Topics   Alcohol use: No   Drug use: Not Currently     Allergies   Amoxicillin   Review of Systems Review of Systems  Constitutional:  Negative for chills and fever.  Gastrointestinal:  Negative for abdominal pain.  Genitourinary:  Negative for dysuria, hematuria, pelvic pain and vaginal  discharge.  Skin:  Negative for rash and wound.  All other systems reviewed and are negative.    Physical Exam Triage Vital Signs ED Triage Vitals  Enc Vitals Group     BP      Pulse      Resp      Temp      Temp src      SpO2      Weight      Height      Head Circumference      Peak Flow      Pain Score      Pain Loc      Pain Edu?      Excl. in [place]?    No data found.  Updated Vital Signs BP 130/78 (BP Location: Left Arm)   Pulse 99   Temp 98 F (36.7 C) (Oral)   Resp 18   LMP 07/08/2021   SpO2 98%   Visual Acuity Right Eye Distance:   Left Eye Distance:   Bilateral Distance:    Right Eye Near:   Left Eye Near:    Bilateral Near:     Physical Exam Vitals and nursing note reviewed.  Constitutional:      General: She is not in acute distress.    Appearance: She is well-developed. She is obese. She is not ill-appearing.  HENT:     Mouth/Throat:     Mouth: Mucous membranes are moist.  Cardiovascular:     Rate and Rhythm: Normal rate and regular rhythm.     Heart sounds: Normal heart sounds.  Pulmonary:     Effort: Pulmonary effort is normal. No respiratory distress.     Breath sounds: Normal breath sounds.  Abdominal:     General: Bowel sounds are normal.     Palpations: Abdomen is soft.     Tenderness: There is no abdominal tenderness. There is no right CVA tenderness, left CVA tenderness, guarding or rebound.  Musculoskeletal:     Cervical back: Neck supple.  Skin:    General: Skin is warm and dry.  Neurological:     Mental Status: She is alert.  Psychiatric:        Mood and Affect: Mood normal.        Behavior: Behavior normal.      UC Treatments / Results  Labs (all labs ordered are listed, but only abnormal results are displayed) Labs Reviewed  POCT URINE PREGNANCY  CERVICOVAGINAL ANCILLARY ONLY    EKG   Radiology No results found.  Procedures Procedures (including critical care time)  Medications Ordered in UC Medications  - No data  to display  Initial Impression / Assessment and Plan / UC Course  I have reviewed the triage vital signs and the nursing notes.  Pertinent labs & imaging results that were available during my care of the patient were reviewed by me and considered in my medical decision making (see chart for details).    STD screening, Exposure to chlamydia.  Patient obtained vaginal self swab for testing.  Treating with doxycycline.  Discussed that we will call if test results are positive.  Discussed that she may require additional treatment at that time.  Instructed patient to abstain from sexual activity for at least 7 days.  Instructed her to follow-up with her PCP or gynecologist as needed.  Patient agrees to plan of care.   Final Clinical Impressions(s) / UC Diagnoses   Final diagnoses:  Screening for STD (sexually transmitted disease)  Exposure to chlamydia     Discharge Instructions      Take doxycycline twice a day for 7 days.    Your tests are pending.  If your test results are positive, we will call you.  You and your sexual partner(s) may require treatment at that time.  Do not have sexual activity for at least 7 days.    Follow up with your primary care provider as needed.        ED Prescriptions     Medication Sig Dispense Auth. Provider   doxycycline (VIBRAMYCIN) 100 MG capsule Take 1 capsule (100 mg total) by mouth 2 (two) times daily for 7 days. 14 capsule Mickie Bailate, Lilou Kneip H, NP      PDMP not reviewed this encounter.   Mickie Bailate, Narjis Mira H, NP 07/14/21 1432

## 2021-07-18 LAB — CERVICOVAGINAL ANCILLARY ONLY
Bacterial Vaginitis (gardnerella): POSITIVE — AB
Candida Glabrata: NEGATIVE
Candida Vaginitis: NEGATIVE
Chlamydia: POSITIVE — AB
Comment: NEGATIVE
Comment: NEGATIVE
Comment: NEGATIVE
Comment: NEGATIVE
Comment: NEGATIVE
Comment: NORMAL
Neisseria Gonorrhea: NEGATIVE
Trichomonas: NEGATIVE

## 2021-07-19 ENCOUNTER — Telehealth (HOSPITAL_COMMUNITY): Payer: Self-pay | Admitting: Emergency Medicine

## 2021-07-19 MED ORDER — METRONIDAZOLE 500 MG PO TABS: 500.0000 mg | ORAL_TABLET | Freq: Two times a day (BID) | ORAL | 0 refills | Status: DC

## 2022-03-12 ENCOUNTER — Encounter: Payer: Self-pay | Admitting: Emergency Medicine

## 2022-03-12 ENCOUNTER — Other Ambulatory Visit: Payer: Self-pay

## 2022-03-12 ENCOUNTER — Emergency Department
Admission: EM | Admit: 2022-03-12 | Discharge: 2022-03-12 | Disposition: A | Discharge status: Home / Self Care | Payer: Medicaid Other | Attending: Emergency Medicine | Admitting: Emergency Medicine

## 2022-03-12 DIAGNOSIS — E119 Type 2 diabetes mellitus without complications: Secondary | ICD-10-CM | POA: Insufficient documentation

## 2022-03-12 DIAGNOSIS — R21 Rash and other nonspecific skin eruption: Secondary | ICD-10-CM | POA: Diagnosis present

## 2022-03-12 DIAGNOSIS — N611 Abscess of the breast and nipple: Secondary | ICD-10-CM | POA: Insufficient documentation

## 2022-03-12 DIAGNOSIS — L732 Hidradenitis suppurativa: Secondary | ICD-10-CM | POA: Insufficient documentation

## 2022-03-12 LAB — POC URINE PREG, ED: Preg Test, Ur: NEGATIVE

## 2022-03-12 MED ORDER — DOXYCYCLINE HYCLATE 100 MG PO TABS
100.0000 mg | ORAL_TABLET | Freq: Once | ORAL | Status: AC
  Administered 2022-03-12: 100 mg via ORAL
  Filled 2022-03-12: qty 1

## 2022-03-12 MED ORDER — DOXYCYCLINE MONOHYDRATE 100 MG PO TABS
100.0000 mg | ORAL_TABLET | Freq: Two times a day (BID) | ORAL | 0 refills | Status: AC
Start: 2022-03-12 — End: 2022-03-22

## 2022-03-12 MED ORDER — LIDOCAINE-EPINEPHRINE 2 %-1:100000 IJ SOLN
20.0000 mL | Freq: Once | INTRAMUSCULAR | Status: AC
  Administered 2022-03-12: 20 mL
  Filled 2022-03-12: qty 1

## 2022-03-12 NOTE — ED Notes (Addendum)
Pt complaining of swollen areas under her arms and breast, has a history of hidradenitis suppurativa. 2 of the areas under the rt breast is draining, and one under the left arm is draining as well.

## 2022-03-12 NOTE — ED Triage Notes (Signed)
Pt arrives via POV with CC of rash in both armpits and nipple soreness in both nipples. Reports "started having trouble" after having child approx one year ago - suspected clogged milk ducts.

## 2022-03-12 NOTE — ED Provider Notes (Signed)
[facility] Provider Note    Event Date/Time   First MD Initiated Contact with Patient 03/12/22 [phone]     (approximate)   History   Rash   HPI  Susan Kaiser is a 27 y.o. female  with pmh DM who presents with rash.  Patient tells me that she has had bilateral breast issues for many months.  The left breast has nipple drainage.  Started out as milk leg and then changed to purulent and at times bloody.  Both breasts are also painful.  Over the last 3 days the right breast has become more painful and swollen.  There is no drainage from the right breast.  She also has rashes under both of her underarms and spontaneous drainage.  She has been diagnosed with at bedtime in 2018 but does not see any provider for it.  Was told that the only treatment was weight loss.  Denies fevers chills.  Checks her blood sugar at home and has been controlled in the 90s.     Past Medical History:  Diagnosis Date   Depression    Diabetes mellitus without complication ([place])    Dysmenorrhea 09/17/2014   Generalized anxiety disorder    Menorrhagia 04/21/2013   Menstrual extraction 05/26/2013   Obesity    PCO (polycystic ovaries) 06/10/2013   Unspecified symptom associated with female genital organs 06/10/2013    Patient Active Problem List   Diagnosis Date Noted   Vaginal delivery 01/26/2021   Encounter for induction of labor 01/23/2021   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 ([place]) 09/20/2020   Supervision of high-risk pregnancy 08/04/2020   Chronic benign essential hypertension, antepartum 08/04/2020   Pre-existing diabetes mellitus affecting pregnancy, antepartum 08/04/2020   BMI 60.0-69.9, adult ([place]) 08/04/2020   Post partum depression 06/16/2019   GAD (generalized anxiety disorder) 02/03/2016   Recurrent major depressive disorder ([place]) 02/03/2016   PCO (polycystic ovaries) 06/10/2013   Obesity affecting pregnancy, antepartum 04/21/2013   Hypertension 11/15/2011    Type 2 diabetes mellitus without complication, without long-term current use of insulin ([place]) 05/26/2011     Physical Exam  Triage Vital Signs: ED Triage Vitals  Enc Vitals Group     BP 03/12/22 0646 (!) 146/94     Pulse Rate 03/12/22 0646 (!) 103     Resp 03/12/22 0646 18     Temp 03/12/22 0646 97.9 F (36.6 C)     Temp Source 03/12/22 0646 Oral     SpO2 03/12/22 0646 96 %     Weight 03/12/22 0646 (!) 400 lb (181.4 kg)     Height 03/12/22 0646 5\' 10"  (1.778 m)     Head Circumference --      Peak Flow --      Pain Score 03/12/22 0649 7     Pain Loc --      Pain Edu? --      Excl. in [place]? --     Most recent vital signs: Vitals:   03/12/22 0646 03/12/22 0748  BP: (!) 146/94 (!) 133/90  Pulse: (!) 103 (!) 102  Resp: 18 17  Temp: 97.9 F (36.6 C)   SpO2: 96% 100%     General: Awake, no distress.  CV:  Good peripheral perfusion.  Resp:  Normal effort.  Abd:  No distention.  Neuro:             Awake, Alert, Oriented x 3  Other:  Sequela of hidradenitis suppurativa in bilateral  axilla; spontaneously draining lesion in the inferior axilla that is mildly tender but no surrounding erythema not fluctuant  Left breast with no significant tenderness or swelling, no spontaneous nipple drainage Right breast with poorly demarcated area of tenderness at the 6 o'clock position in the subareolar region, no significant surrounding cellulitis, no fluctuance, no swelling There is an area of ulceration inferior to this area of tenderness on the right breast with no drainage   ED Results / Procedures / Treatments  Labs (all labs ordered are listed, but only abnormal results are displayed) Labs Reviewed  POC URINE PREG, ED     EKG     RADIOLOGY I performed and interpreted a bedside ultrasound of the right breast which shows a small subareolar fluid collection at the 6 o'clock position less than 1 cm deep   PROCEDURES:  Critical Care performed: No  ..Incision and  Drainage  Date/Time: 03/12/2022 7:47 AM  Performed by: Rada Hay, MD Authorized by: Rada Hay, MD   Consent:    Consent obtained:  Verbal   Risks discussed:  Bleeding, incomplete drainage and pain Universal protocol:    Patient identity confirmed:  Verbally with patient Location:    Type:  Abscess   Location:  Trunk   Trunk location:  R breast Pre-procedure details:    Skin preparation:  Chlorhexidine with alcohol Sedation:    Sedation type:  None Anesthesia:    Anesthesia method:  Local infiltration   Local anesthetic:  Lidocaine 2% WITH epi Procedure type:    Complexity:  Simple Procedure details:    Ultrasound guidance: yes     Needle aspiration: yes     Needle size:  18 G   Drainage:  Purulent   Drainage amount:  Scant   Packing materials:  None Post-procedure details:    Procedure completion:  Tolerated   The patient is on the cardiac monitor to evaluate for evidence of arrhythmia and/or significant heart rate changes.   MEDICATIONS ORDERED IN ED: Medications  lidocaine-EPINEPHrine (XYLOCAINE W/EPI) 2 %-1:100000 (with pres) injection 20 mL (20 mLs Infiltration Given 03/12/22 0740)  doxycycline (VIBRA-TABS) tablet 100 mg (100 mg Oral Given 03/12/22 0745)     IMPRESSION / MDM / ASSESSMENT AND PLAN / ED COURSE  I reviewed the triage vital signs and the nursing notes.                              Patient's presentation is most consistent with acute complicated illness / injury requiring diagnostic workup.  Differential diagnosis includes, but is not limited to, hidradenitis suppurativa, breast abscess, breast phlegmon, cellulitis, malignancy  The patient is a 27 year old female with obesity and prediabetes who presents because of rash in the bilateral axilla and right breast.  She has had axillary rash for many years and has been diagnosed with HS.  Clinically she has sequela of chronic HS in the bilateral axilla with areas of active inflammation  specifically in the right underarm where there is some spontaneous drainage.  No clinical concern for abscess.  Her main concern is the right breast which has been more tender over the last 3 days she has noticed some swelling without drainage.  She has had left breast pain for several months with nipple drainage but this is not a new issue today.  On exam she does have an area of tenderness at the 6 o'clock position subareolar that is rather superficial without surrounding  cellulitis.  Given concern for breast abscess I did perform an ultrasound which shows a very small superficial fluid collection.  Question whether this is also related to her at bedtime.  I did perform a ultrasound-guided needle aspiration given the small size did not feel that I&D was necessary.  I was able to aspirate a small amount of purulence.  Will prescribe 10 days of doxycycline which I think will help the breast and the at bedtime.  Encourage patient to follow-up with both primary care and dermatology and we did discuss that at bedtime has other treatments other than just weight loss.  I have referred her to dermatology.  Also give her primary care follow-up.  Did discuss that if despite being on the antibiotics the breast continues to have increasing pain swelling or spontaneous drainage that she would need to return to the ED as this is concerning for a growing abscess.   Clinical Course as of 03/12/22 0750  Sun Mar 12, 2022  0723 Preg Test, Ur: Negative [KM]    Clinical Course User Index [KM] Rada Hay, MD     FINAL CLINICAL IMPRESSION(S) / ED DIAGNOSES   Final diagnoses:  Hydradenitis  Breast abscess     Rx / DC Orders   ED Discharge Orders          Ordered    doxycycline (ADOXA) 100 MG tablet  2 times daily        03/12/22 1497             Note:  This document was prepared using Dragon voice recognition software and may include unintentional dictation errors.   Rada Hay,  MD 03/12/22 [phone]

## 2022-03-12 NOTE — Discharge Instructions (Signed)
Please follow-up with dermatology regarding her hidradenitis suppurativa.  Please take doxycycline twice a day for the next 10 days.  Please use warm compresses over the breast abscess.  If despite being on antibiotics your breast is becoming more swollen and painful and red please return to the emergency department.  Is also important that you follow-up with a primary care doctor regarding your breast pain and nipple drainage as you will likely need ultrasound of both breasts. You can follow-up with one of the primary care doctors listed below.  Please go to the following website to schedule new (and existing) patient appointments:   [email or web address]   The following is a list of primary care offices in the area who are accepting new patients at this time.  Please reach out to one of them directly and let them know you would like to schedule an appointment to follow up on an Emergency Department visit, and/or to establish a new primary care provider (PCP).  There are likely other primary care clinics in the are who are accepting new patients, but this is an excellent place to start:  [facility] physician: Dr Lavon Paganini [address] [place], [place] [address] [phone]  [facility] Lead Physician: Dr Steele Sizer [address], [place], [place] [address] [phone]  [facility] Physician: Dr Park Liter [address] [place], [place] [address] [phone]  [facility] Lead Physician: Dr Dewaine Oats [address], [place], [place] [address] [phone]  [facility] Physician: Dr Halina Maidens [address] [place], [place], [place] [address] [phone]

## 2022-05-20 ENCOUNTER — Encounter: Payer: Self-pay | Admitting: Emergency Medicine

## 2022-05-20 ENCOUNTER — Other Ambulatory Visit: Payer: Self-pay

## 2022-05-20 ENCOUNTER — Emergency Department
Admission: EM | Admit: 2022-05-20 | Discharge: 2022-05-21 | Discharge status: Against Medical Advice | Payer: Medicaid Other | Attending: Emergency Medicine | Admitting: Emergency Medicine

## 2022-05-20 DIAGNOSIS — Z5321 Procedure and treatment not carried out due to patient leaving prior to being seen by health care provider: Secondary | ICD-10-CM | POA: Insufficient documentation

## 2022-05-20 DIAGNOSIS — N644 Mastodynia: Secondary | ICD-10-CM | POA: Insufficient documentation

## 2022-05-20 LAB — COMPREHENSIVE METABOLIC PANEL
ALT: 24 U/L (ref 0–44)
AST: 28 U/L (ref 15–41)
Albumin: 3.7 g/dL (ref 3.5–5.0)
Alkaline Phosphatase: 46 U/L (ref 38–126)
Anion gap: 9 (ref 5–15)
BUN: 10 mg/dL (ref 6–20)
CO2: 23 mmol/L (ref 22–32)
Calcium: 9 mg/dL (ref 8.9–10.3)
Chloride: 104 mmol/L (ref 98–111)
Creatinine, Ser: 0.98 mg/dL (ref 0.44–1.00)
GFR, Estimated: >60 mL/min (ref >60)
Glucose, Bld: 107 mg/dL — ABNORMAL HIGH (ref 70–99)
Potassium: 3.8 mmol/L (ref 3.5–5.1)
Sodium: 136 mmol/L (ref 135–145)
Total Bilirubin: 0.8 mg/dL (ref 0.3–1.2)
Total Protein: 7.6 g/dL (ref 6.5–8.1)

## 2022-05-20 LAB — CBC WITH DIFFERENTIAL/PLATELET
Abs Immature Granulocytes: 0.03 10*3/uL (ref 0.00–0.07)
Basophils Absolute: 0 10*3/uL (ref 0.0–0.1)
Basophils Relative: 0 %
Eosinophils Absolute: 0.2 10*3/uL (ref 0.0–0.5)
Eosinophils Relative: 3 %
HCT: 42.6 % (ref 36.0–46.0)
Hemoglobin: 13.9 g/dL (ref 12.0–15.0)
Immature Granulocytes: 0 %
Lymphocytes Relative: 24 %
Lymphs Abs: 1.7 10*3/uL (ref 0.7–4.0)
MCH: 27.6 pg (ref 26.0–34.0)
MCHC: 32.6 g/dL (ref 30.0–36.0)
MCV: 84.7 fL (ref 80.0–100.0)
Monocytes Absolute: 0.5 10*3/uL (ref 0.1–1.0)
Monocytes Relative: 7 %
Neutro Abs: 4.5 10*3/uL (ref 1.7–7.7)
Neutrophils Relative %: 66 %
Platelets: 119 10*3/uL — ABNORMAL LOW (ref 150–400)
RBC: 5.03 MIL/uL (ref 3.87–5.11)
RDW: 16.5 % — ABNORMAL HIGH (ref 11.5–15.5)
WBC: 6.9 10*3/uL (ref 4.0–10.5)
nRBC: 0 % (ref 0.0–0.2)

## 2022-05-20 NOTE — ED Triage Notes (Signed)
Pt in via POV, reports recently dx w/ hydradenitis and breast abscess; reports abscess resolved and has now reoccurred.  Reports right breast tenderness below right areola x 3-5 days.  Denies any discharge.    States she did not make follow up app as advised.  NAD noted at this time.

## 2022-05-21 LAB — HCG, QUANTITATIVE, PREGNANCY: hCG, Beta Chain, Quant, S: <1 m[IU]/mL (ref <5)

## 2022-06-02 IMAGING — US US MFM FETAL BPP W/O NON-STRESS
1 series · 14 of 28 positions shown · non-contrast
Comparison: none

[Series 1: us mfm fetal bpp w/o non-stress · 33 acquisitions, 14 frames shown]
[im 2/33]
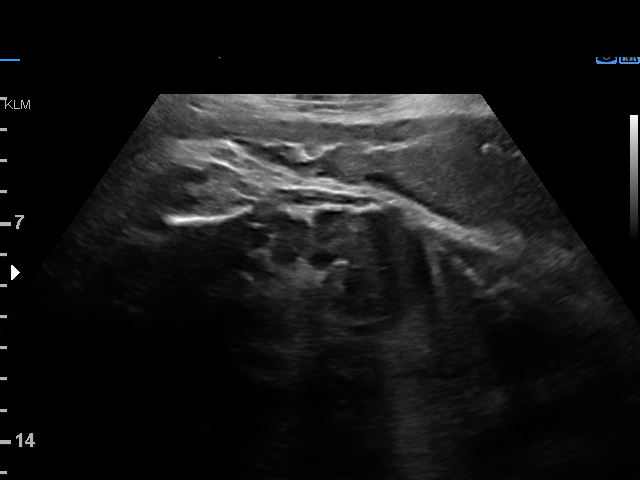
[im 4/33]
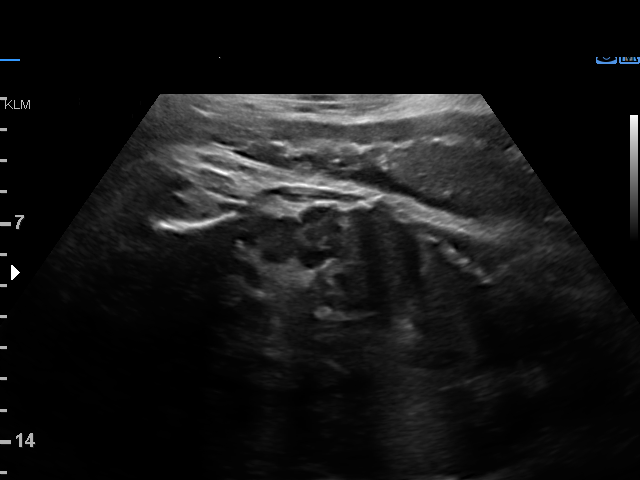
[im 6/33]
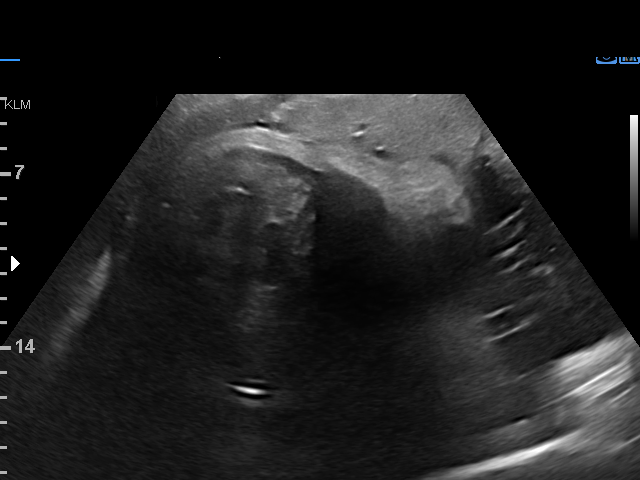
[im 9/33]
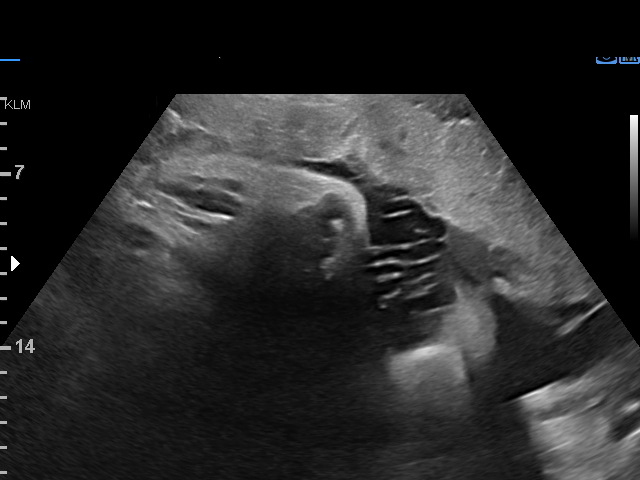
[im 11/33]
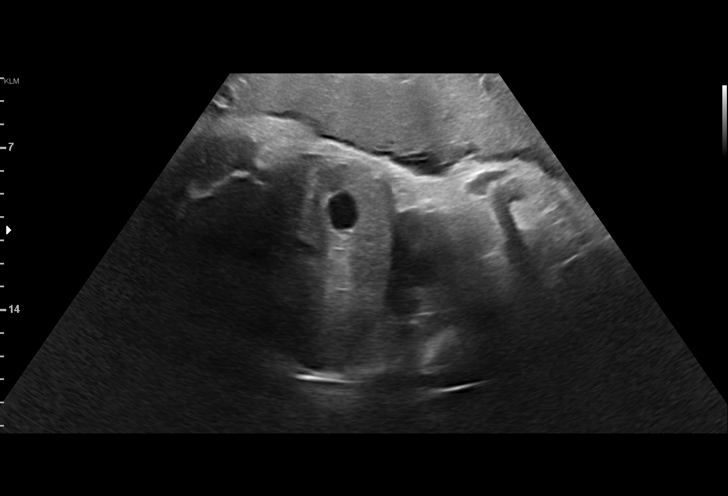
[im 14/33]
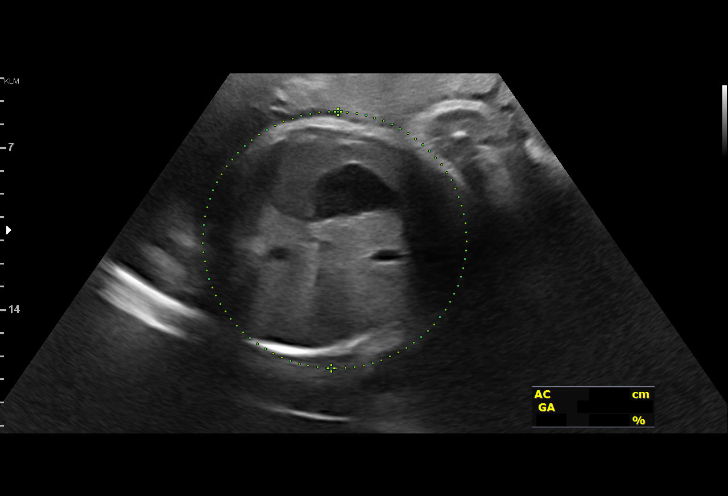
[im 16/33]
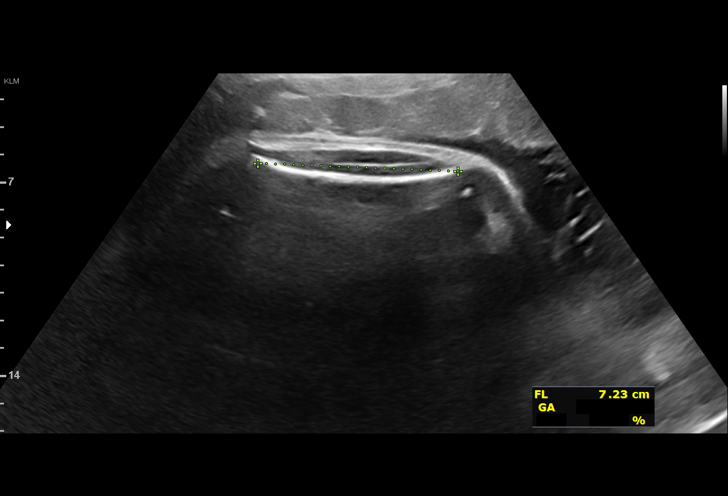
[im 18/33]
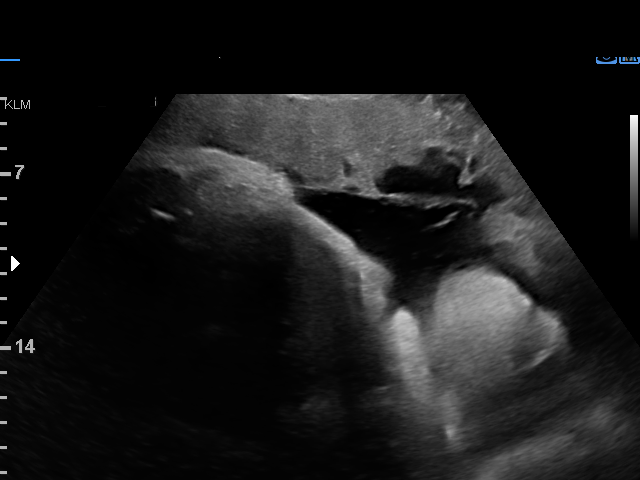
[im 21/33]
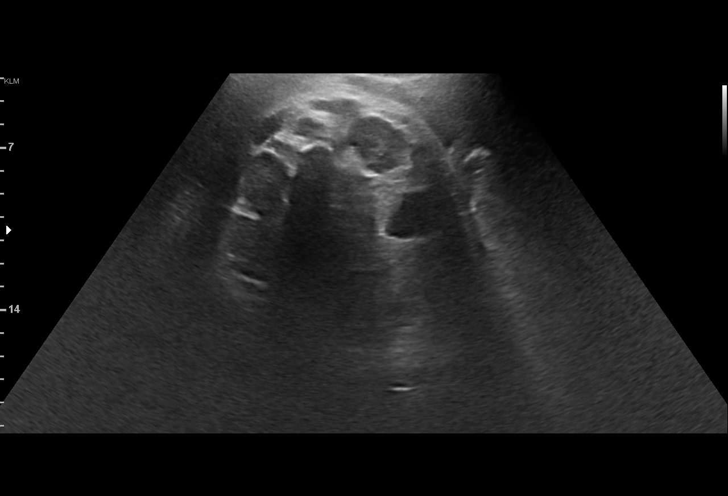
[im 23/33]
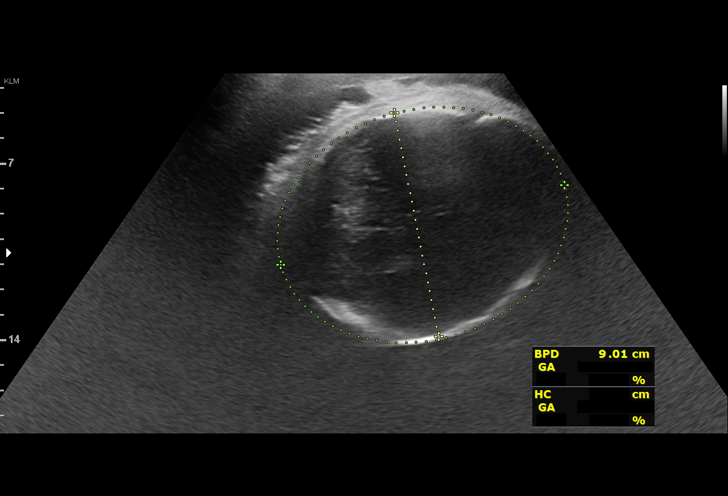
[im 25/33]
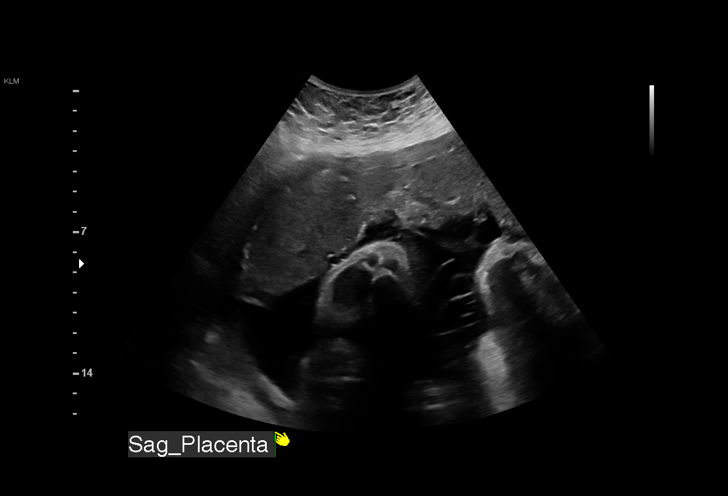
[im 28/33]
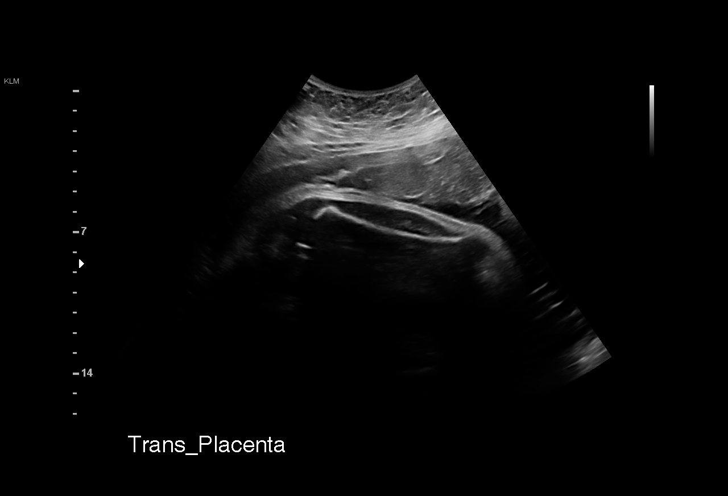
[im 30/33]
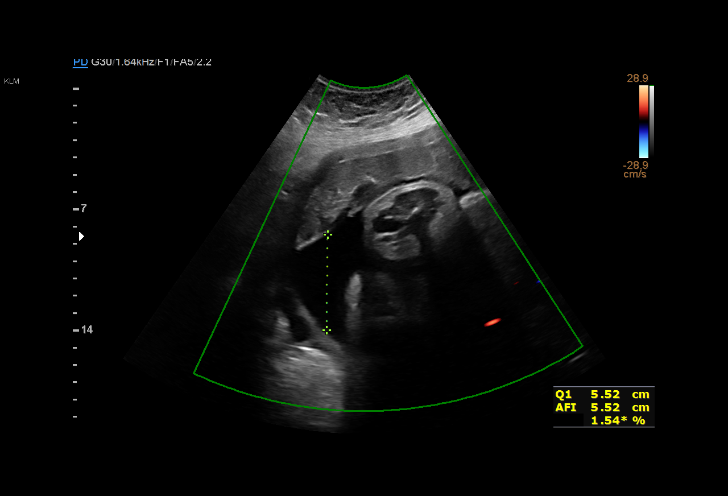
[im 33/33]
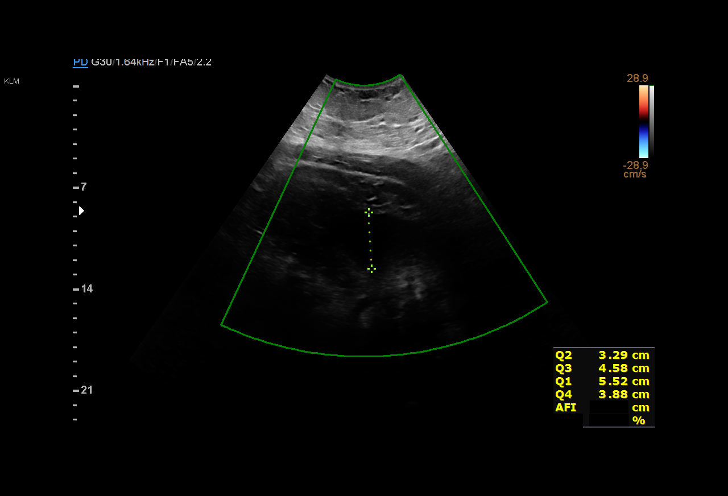

[14 of 28 positions shown; findings below may reference images not displayed]

Indications

 Pre-existing diabetes, type 2, in pregnancy,
 third trimester
 37 weeks gestation of pregnancy
 Pre-existing essential hypertension
 complicating pregnancy, third trimester
 Obesity complicating pregnancy, third
 trimester
Fetal Evaluation

 Num Of Fetuses:         1
 Fetal Heart Rate(bpm):  166
 Cardiac Activity:       Observed
 Presentation:           Cephalic
 Placenta:               Anterior
 P. Cord Insertion:      Previously Visualized

 Amniotic Fluid
 AFI FV:      Within normal limits

 AFI Sum(cm)     %Tile       Largest Pocket(cm)
 17.3            67

 RUQ(cm)       RLQ(cm)       LUQ(cm)        LLQ(cm)

Biophysical Evaluation
 Amniotic F.V:   Within normal limits       F. Tone:        Observed
 F. Movement:    Observed                   Score:          [DATE]
 F. Breathing:   Observed
Biometry

 BPD:      90.2  mm     G. Age:  36w 4d         41  %    CI:        75.78   %    70 - 86
                                                         FL/HC:      22.2   %    20.9 -
 HC:      328.5  mm     G. Age:  37w 2d         22  %    HC/AC:      0.95        0.92 -
 AC:      346.7  mm     G. Age:  38w 4d         88  %    FL/BPD:     80.9   %    71 - 87
 FL:         73  mm     G. Age:  37w 3d         46  %    FL/AC:      21.1   %    20 - 24

 Est. FW:    8851  gm      7 lb 6 oz     69  %
OB History

 Blood Type:   AB+
 Gravidity:    2         Term:   1        Prem:   0        SAB:   0
 TOP:          0       Ectopic:  0        Living: 1
Gestational Age

 LMP:           37w 4d        Date:  04/25/20                 EDD:   01/30/21
 U/S Today:     37w 3d                                        EDD:   01/31/21
 Best:          37w 4d     Det. By:  LMP  (04/25/20)          EDD:   01/30/21
Anatomy

 Cranium:               Appears normal         LVOT:                   Previously seen
 Cavum:                 Previously seen        Aortic Arch:            Previously seen
 Ventricles:            Previously seen        Ductal Arch:            Previously seen
 Choroid Plexus:        Previously seen        Diaphragm:              Appears normal
 Cerebellum:            Previously seen        Stomach:                Appears normal, left
                                                                       sided
 Posterior Fossa:       Previously seen        Abdomen:                Previously seen
 Nuchal Fold:           Not applicable (>20    Abdominal Wall:         Previously seen
                        wks GA)
 Face:                  Orbits and profile     Cord Vessels:           Previously seen
                        previously seen
 Lips:                  Not well visualized    Kidneys:                Appear normal
 Palate:                Not well visualized    Bladder:                Appears normal
 Thoracic:              Previously seen        Spine:                  Not well visualized
 Heart:                 Previously seen        Upper Extremities:      Previously seen
 RVOT:                  Previously seen        Lower Extremities:      Previously seen

 Other:  Female gender previously seen. Nasal bone, Heels and Open hands
         previously visualized. Technically difficult due to maternal habitus and
         fetal position. Lenses not well visualized.
Cervix Uterus Adnexa

 Cervix
 Not visualized (advanced GA >56wks)
Impression

 Follow up growth due to Type 2 DM, managed by diet and
 BMI 60
 Normal interval growth with measurements consistent with
 dates
 Good fetal movement and amniotic fluid volume
 Biophysical profile [DATE]

 Suboptimal views of the spine and lips again noted due to
 elevated BMI

 She reports good blood sugar control which is confirmed by
 provider visit today as well.
Recommendations

 Continue weekly testing
 Planned IOL at 39 weeks.

## 2022-06-23 IMAGING — US US PELVIS COMPLETE
1 series · 15 of 25 positions shown · non-contrast
Comparison: 01/13/2021

CLINICAL DATA: Postpartum bleeding. Spontaneous vaginal delivery on
01/24/2021

EXAM:
TRANSABDOMINAL ULTRASOUND OF PELVIS
TECHNIQUE: Transabdominal ultrasound examination of the pelvis was performed
including evaluation of the uterus, ovaries, adnexal regions, and
pelvic cul-de-sac.

[Series 1: us pelvis complete · 15 of 25 slices shown]
[im 1/25]
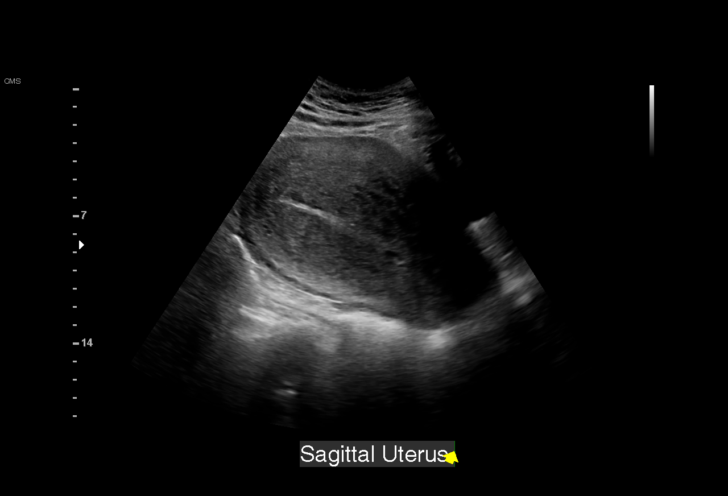
[im 3/25]
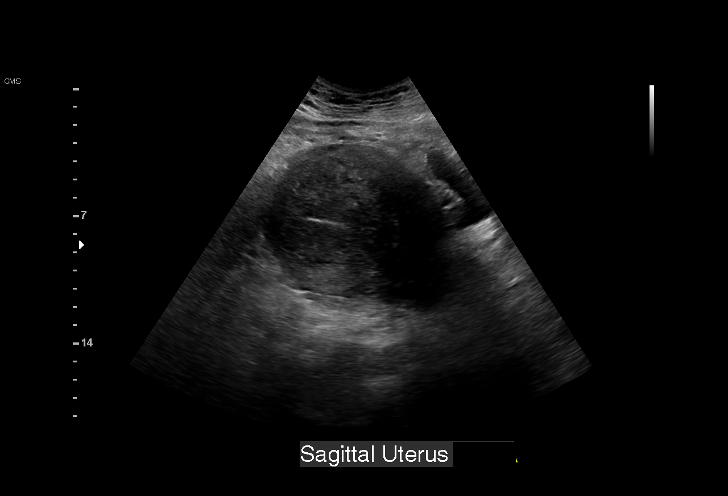
[im 5/25]
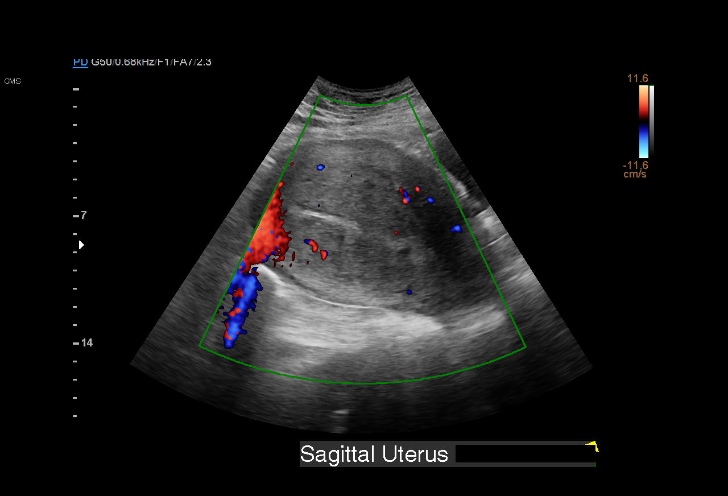
[im 6/25]
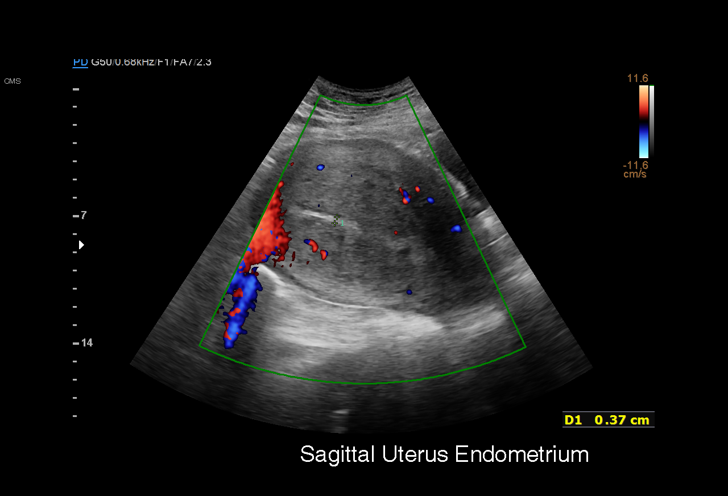
[im 8/25]
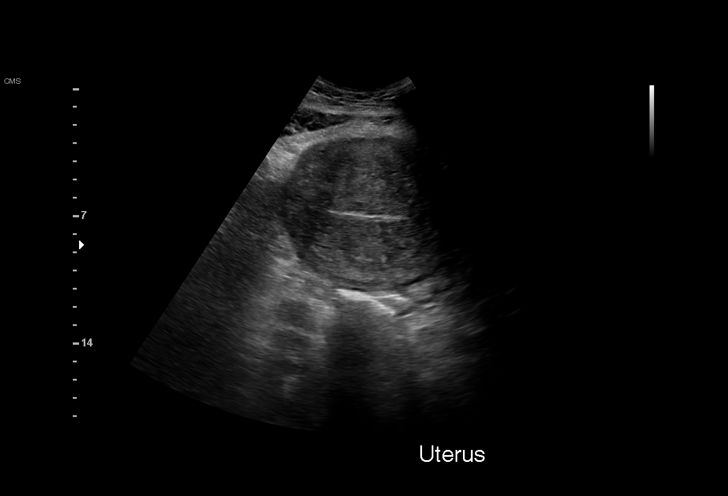
[im 10/25]
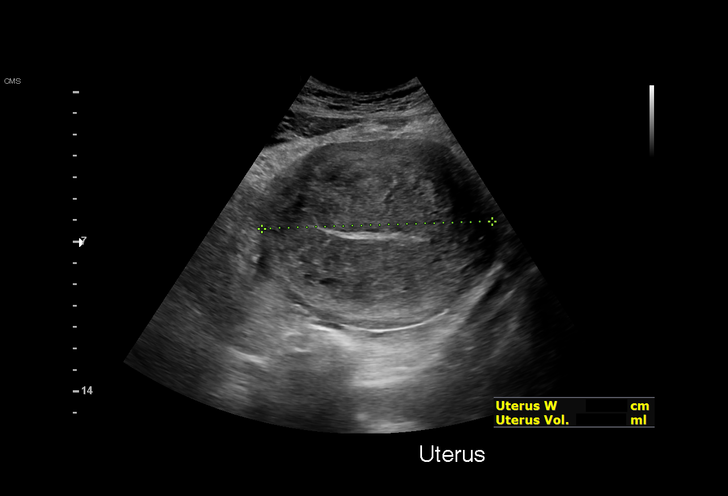
[im 11/25]
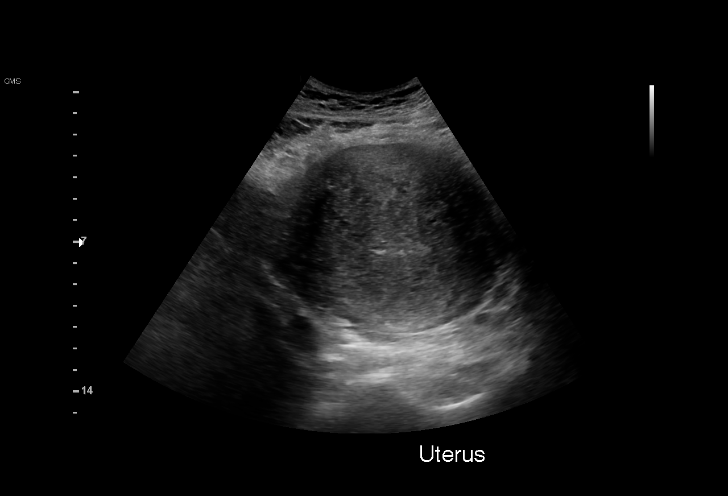
[im 13/25]
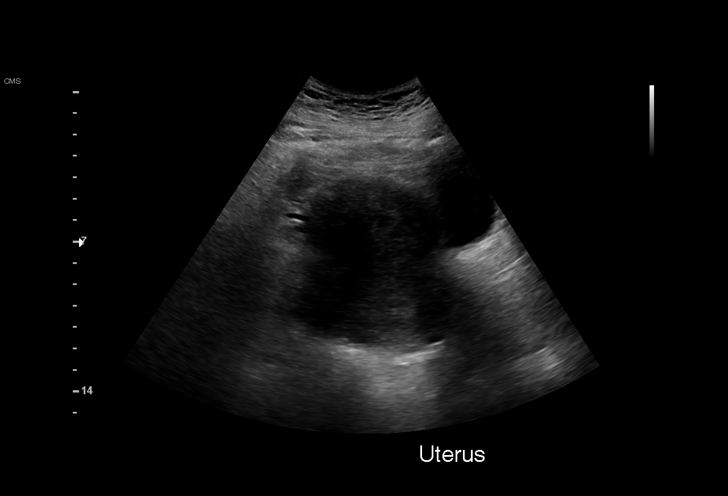
[im 15/25]
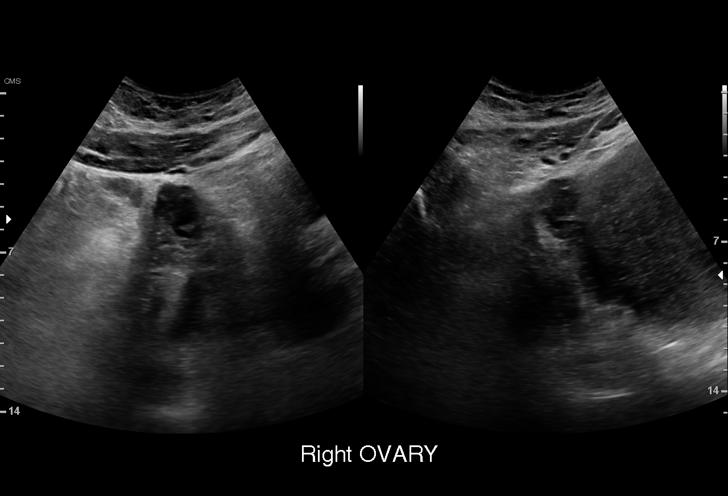
[im 16/25]
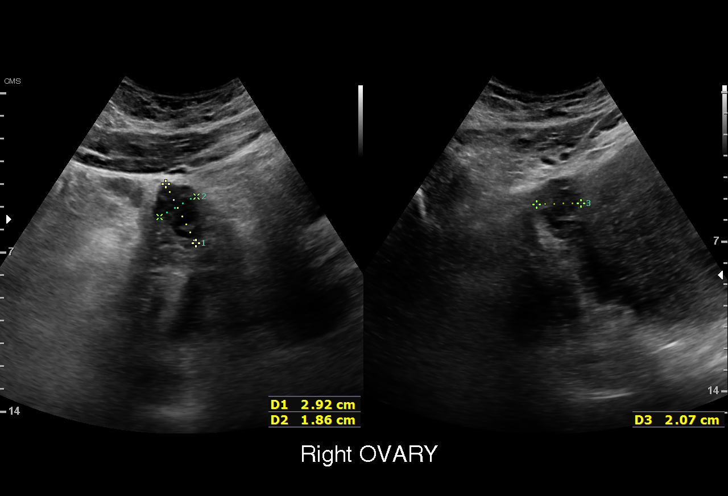
[im 18/25]
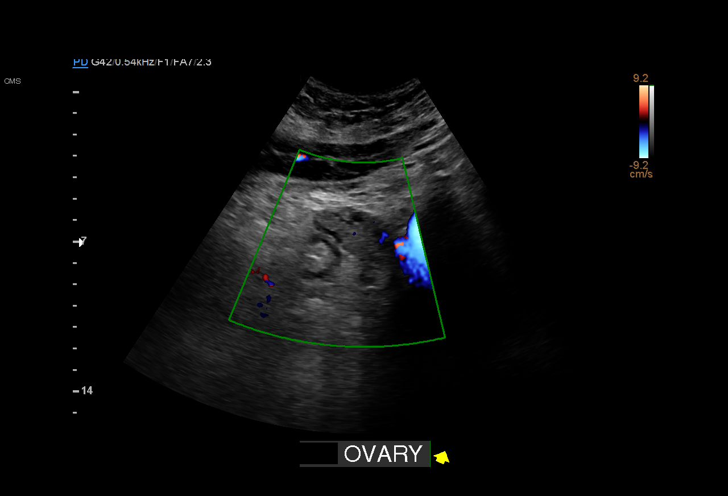
[im 20/25]
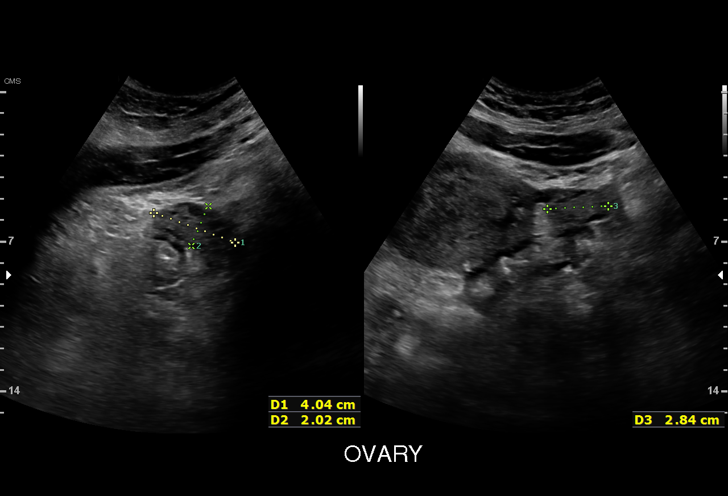
[im 21/25]
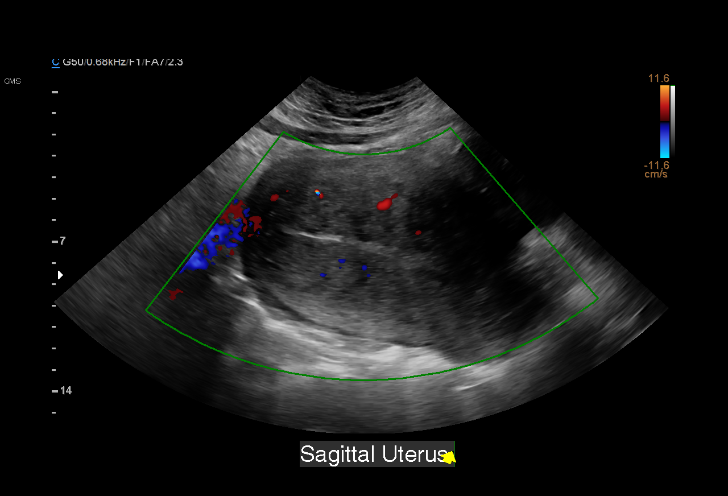
[im 23/25]
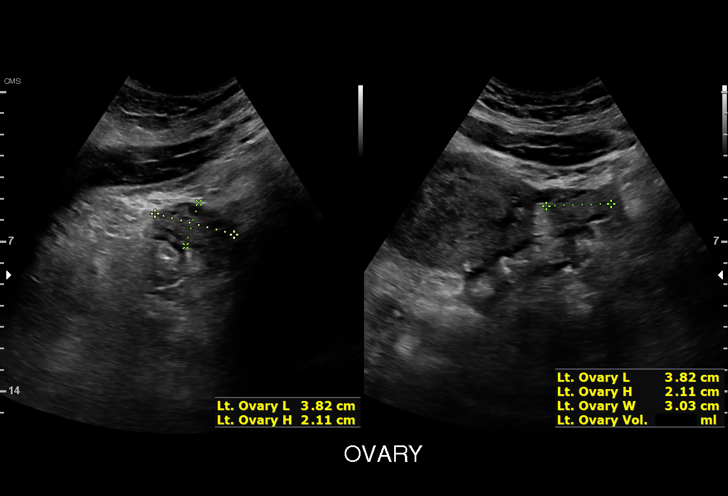
[im 25/25]
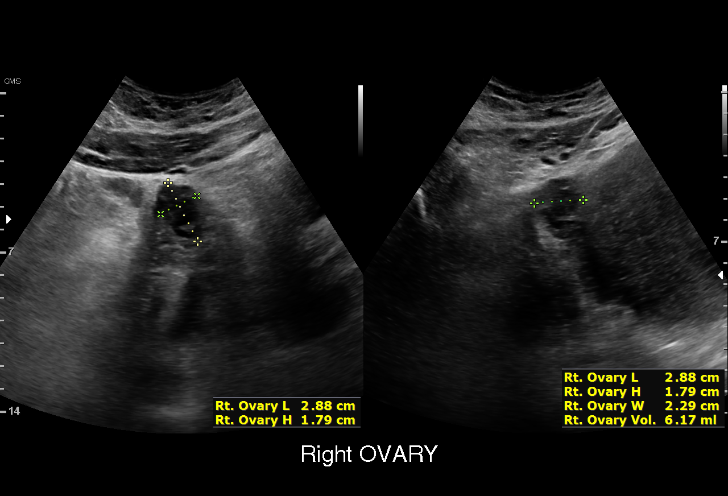

[15 of 25 positions shown; findings below may reference images not displayed]

FINDINGS: Uterus

Measurements: 14.5 x 10.8 x 9.1 cm = volume: 745 mL. Uterus is
diffusely enlarged consistent with postpartum state. No myometrial
mass lesions identified.

Endometrium

Thickness: 4 mm.  No focal abnormality visualized.

Right ovary

Measurements: 2.9 x 1.8 x 2.3 cm = volume: 6.2 mL. Normal
appearance/no adnexal mass.

Left ovary

Measurements: 3.8 x 2.1 x 3 cm = volume: 12.8 mL. Normal
appearance/no adnexal mass.

Other findings:  No abnormal free fluid.
IMPRESSION: Diffuse uterine enlargement consistent with recent postpartum state.
No myometrial mass lesion. Endometrial stripe is normal. Both
ovaries are normal.

## 2022-10-18 ENCOUNTER — Other Ambulatory Visit: Payer: Self-pay

## 2022-10-18 ENCOUNTER — Emergency Department
Admission: EM | Admit: 2022-10-18 | Discharge: 2022-10-18 | Disposition: A | Discharge status: Home / Self Care | Payer: Medicaid Other | Attending: Emergency Medicine | Admitting: Emergency Medicine

## 2022-10-18 ENCOUNTER — Telehealth: Payer: Self-pay

## 2022-10-18 ENCOUNTER — Emergency Department: Payer: Medicaid Other

## 2022-10-18 DIAGNOSIS — I10 Essential (primary) hypertension: Secondary | ICD-10-CM | POA: Diagnosis not present

## 2022-10-18 DIAGNOSIS — E119 Type 2 diabetes mellitus without complications: Secondary | ICD-10-CM | POA: Insufficient documentation

## 2022-10-18 DIAGNOSIS — N921 Excessive and frequent menstruation with irregular cycle: Secondary | ICD-10-CM | POA: Diagnosis not present

## 2022-10-18 DIAGNOSIS — R102 Pelvic and perineal pain: Secondary | ICD-10-CM | POA: Diagnosis present

## 2022-10-18 LAB — URINALYSIS, W/ REFLEX TO CULTURE (INFECTION SUSPECTED)
Bilirubin Urine: NEGATIVE
Glucose, UA: NEGATIVE mg/dL
Ketones, ur: NEGATIVE mg/dL
Leukocytes,Ua: NEGATIVE
Nitrite: NEGATIVE
Protein, ur: 30 mg/dL — AB
RBC / HPF: >50 RBC/hpf (ref 0–5)
Specific Gravity, Urine: 1.017 (ref 1.005–1.030)
pH: 6 (ref 5.0–8.0)

## 2022-10-18 LAB — WET PREP, GENITAL
Clue Cells Wet Prep HPF POC: NONE SEEN
Sperm: NONE SEEN
Trich, Wet Prep: NONE SEEN
WBC, Wet Prep HPF POC: <10 (ref <10)
Yeast Wet Prep HPF POC: NONE SEEN

## 2022-10-18 LAB — BASIC METABOLIC PANEL
Anion gap: 11 (ref 5–15)
BUN: 6 mg/dL (ref 6–20)
CO2: 22 mmol/L (ref 22–32)
Calcium: 8.7 mg/dL — ABNORMAL LOW (ref 8.9–10.3)
Chloride: 106 mmol/L (ref 98–111)
Creatinine, Ser: 0.81 mg/dL (ref 0.44–1.00)
GFR, Estimated: >60 mL/min (ref >60)
Glucose, Bld: 94 mg/dL (ref 70–99)
Potassium: 3.8 mmol/L (ref 3.5–5.1)
Sodium: 139 mmol/L (ref 135–145)

## 2022-10-18 LAB — CBC
HCT: 36.6 % (ref 36.0–46.0)
Hemoglobin: 12.1 g/dL (ref 12.0–15.0)
MCH: 28.9 pg (ref 26.0–34.0)
MCHC: 33.1 g/dL (ref 30.0–36.0)
MCV: 87.6 fL (ref 80.0–100.0)
Platelets: 206 10*3/uL (ref 150–400)
RBC: 4.18 MIL/uL (ref 3.87–5.11)
RDW: 15 % (ref 11.5–15.5)
WBC: 6.7 10*3/uL (ref 4.0–10.5)
nRBC: 0 % (ref 0.0–0.2)

## 2022-10-18 LAB — CHLAMYDIA/NGC RT PCR (ARMC ONLY)
Chlamydia Tr: NOT DETECTED
N gonorrhoeae: NOT DETECTED

## 2022-10-18 LAB — POC URINE PREG, ED: Preg Test, Ur: NEGATIVE

## 2022-10-18 NOTE — Telephone Encounter (Signed)
Patient called stating she has been having vaginal bleeding and painful intercourse for 2 weeks. Patient states she is feeling dizzy and changing pads every hour. Advised patient to go to the Emergency room. Understanding was voiced. Kinaya Hilliker l Calaya Gildner, CMA

## 2022-10-18 NOTE — ED Provider Notes (Signed)
[facility] Provider Note    Event Date/Time   First MD Initiated Contact with Patient 10/18/22 1916     (approximate)   History   Vaginal Bleeding   HPI  Susan Kaiser is a 27 y.o. female   Past medical history of PCOS, menorrhagia, diabetes, depression, generalized anxiety disorder who presents emergency department with right-sided pelvic cramping pain as well as a menstrual period that started 2 weeks ago that has been heavier and longer lasting than normal.  Some blood clots, no discharge.  Denies nausea or vomiting.  Denies any urinary symptoms or changes in bowel movements or GI bleeding.  The bleeding and pain started after intercourse approximately 2 weeks ago.  External Medical Documents Reviewed: Prior pelvic ultrasounds that showed normal-appearing adnexa      Physical Exam   Triage Vital Signs: ED Triage Vitals  Encounter Vitals Group     BP 10/18/22 1714 (!) 152/96     Systolic BP Percentile --      Diastolic BP Percentile --      Pulse Rate 10/18/22 1714 (!) 101     Resp 10/18/22 1712 20     Temp 10/18/22 1712 98.5 F (36.9 C)     Temp src --      SpO2 10/18/22 1714 96 %     Weight 10/18/22 1712 (!) 400 lb (181.4 kg)     Height 10/18/22 1712 5\' 10"  (1.778 m)     Head Circumference --      Peak Flow --      Pain Score 10/18/22 1712 7     Pain Loc --      Pain Education --      Exclude from Growth Chart --     Most recent vital signs: Vitals:   10/18/22 1714 10/18/22 2349  BP: (!) 152/96 (!) 139/96  Pulse: (!) 101 95  Resp:  16  Temp:    SpO2: 96% 97%    General: Awake, no distress.  CV:  Good peripheral perfusion.  Resp:  Normal effort.  Abd:  No distention.  Other:  Awake alert comfortable appearing no acute distress.  Mild tachycardia, mild hypertension otherwise vital signs normal.  Soft nontender abdomen to palpation all quadrants.  Pelvic exam shows some blood and clots in the vaginal vault but no open os and  no active bleeding.  There are no trauma to the vaginal wall that I can see.   ED Results / Procedures / Treatments   Labs (all labs ordered are listed, but only abnormal results are displayed) Labs Reviewed  BASIC METABOLIC PANEL - Abnormal; Notable for the following components:      Result Value   Calcium 8.7 (*)    All other components within normal limits  URINALYSIS, W/ REFLEX TO CULTURE (INFECTION SUSPECTED) - Abnormal; Notable for the following components:   Color, Urine YELLOW (*)    APPearance HAZY (*)    Hgb urine dipstick LARGE (*)    Protein, ur 30 (*)    Bacteria, UA RARE (*)    All other components within normal limits  CHLAMYDIA/NGC RT PCR ([facility] ONLY)            WET PREP, GENITAL  CBC  POC URINE PREG, ED     I ordered and reviewed the above labs they are notable for rare bacteria in the urine, negative chlamydia and GC, normal H&H    RADIOLOGY I independently reviewed and interpreted pelvic  ultrasound and see no fibroids I also reviewed radiologist's formal read.   PROCEDURES:  Critical Care performed: No  Procedures   MEDICATIONS ORDERED IN ED: Medications - No data to display   IMPRESSION / MDM / ASSESSMENT AND PLAN / ED COURSE  I reviewed the triage vital signs and the nursing notes.                                Patient's presentation is most consistent with acute presentation with potential threat to life or bodily function.  Differential diagnosis includes, but is not limited to, acute blood loss anemia, abnormal uterine bleeding, ovarian torsion, ectopic pregnancy or other pregnancy related complication, appendicitis   The patient is on the cardiac monitor to evaluate for evidence of arrhythmia and/or significant heart rate changes.  MDM:    Need to rule out ovarian torsion this patient with cramping right-sided abdominal/pelvic pain and history of PCOS.  Fortunately transvaginal ultrasound is negative.  She wants STI testing which  is negative.  Considered acute blood loss anemia but H&H is normal and vital signs unremarkable doubt significant blood loss.  Check for fibroids but negative on ultrasound.  Check for trauma on my pelvic exam but none.  Fortunately given unremarkable workup, patient to be discharged with gynecology referral as follow-up for abnormal uterine bleeding.       FINAL CLINICAL IMPRESSION(S) / ED DIAGNOSES   Final diagnoses:  Menometrorrhagia  Pelvic pain     Rx / DC Orders   ED Discharge Orders     None        Note:  This document was prepared using Dragon voice recognition software and may include unintentional dictation errors.    Pilar Jarvis, MD 10/18/22 [phone]

## 2022-10-18 NOTE — Discharge Instructions (Addendum)
Call gynecology for a follow-up visit.  Fortunately your testing in the emergency department did not show any emergency conditions like ovarian torsion.  Your blood levels are good and healthy right now.  Take acetaminophen 650 mg and ibuprofen 400 mg every 6 hours for pain.  Take with food.  Thank you for choosing [place] for your health care today!  Please see your primary doctor this week for a follow up appointment.   If you have any new, worsening, or unexpected symptoms call your doctor right away or come back to the emergency department for reevaluation.  It was my pleasure to care for you today.   Daneil Dan Modesto Charon, MD

## 2022-10-18 NOTE — ED Triage Notes (Signed)
Pt to ED for vaginal bleeding x2 weeks after sexual encounter. Reports goes through over 5 tampons and pads in a day.

## 2022-10-24 ENCOUNTER — Encounter: Payer: Self-pay | Admitting: Obstetrics and Gynecology

## 2022-10-24 ENCOUNTER — Encounter: Payer: Self-pay | Admitting: Family Medicine

## 2022-10-25 ENCOUNTER — Ambulatory Visit: Payer: Medicaid Other | Admitting: Family Medicine

## 2022-10-25 ENCOUNTER — Other Ambulatory Visit (HOSPITAL_BASED_OUTPATIENT_CLINIC_OR_DEPARTMENT_OTHER): Payer: Self-pay

## 2022-10-25 ENCOUNTER — Encounter: Payer: Self-pay | Admitting: Obstetrics and Gynecology

## 2022-10-25 ENCOUNTER — Ambulatory Visit (INDEPENDENT_AMBULATORY_CARE_PROVIDER_SITE_OTHER): Payer: Medicaid Other | Admitting: Obstetrics and Gynecology

## 2022-10-25 VITALS — BP 110/64 | HR 85 | Wt >= 6400 oz

## 2022-10-25 DIAGNOSIS — N939 Abnormal uterine and vaginal bleeding, unspecified: Secondary | ICD-10-CM | POA: Diagnosis not present

## 2022-10-25 DIAGNOSIS — E282 Polycystic ovarian syndrome: Secondary | ICD-10-CM

## 2022-10-25 MED ORDER — NORETHINDRONE-ETH ESTRADIOL 0.4-35 MG-MCG PO TABS: 1.0000 | ORAL_TABLET | Freq: Every day | ORAL | 11 refills | Status: DC

## 2022-10-25 MED ORDER — NORETHINDRONE-ETH ESTRADIOL 0.4-35 MG-MCG PO TABS: ORAL_TABLET | ORAL | 0 refills | Status: DC

## 2022-10-25 MED ORDER — NORETHINDRONE-ETH ESTRADIOL 0.4-35 MG-MCG PO TABS
1.0000 | ORAL_TABLET | Freq: Every day | ORAL | 11 refills | Status: DC
  Filled 2022-10-25: qty 28, 28d supply, fill #0

## 2022-10-25 MED ORDER — NORETHINDRONE-ETH ESTRADIOL 0.4-35 MG-MCG PO TABS
ORAL_TABLET | ORAL | 0 refills | Status: DC
  Filled 2022-10-25: qty 28, 12d supply, fill #0

## 2022-10-25 MED ORDER — NORETHINDRONE-ETH ESTRADIOL 0.4-35 MG-MCG PO TABS
1.0000 | ORAL_TABLET | Freq: Every day | ORAL | 11 refills | Status: DC
  Filled 2022-10-25 – 2022-11-10 (×2): qty 28, 28d supply, fill #0

## 2022-10-25 NOTE — Progress Notes (Signed)
26 to P2 with LMP 10/07/22 abd BMI 60 presenting today for the evaluation of abnormal uterine bleeding. Patient reports onset of menses in August with persistent vaginal bleeding for the past 3 weeks. She describes the bleeding as heavy with passage of clots needing to change 6 pads a day. This period of heavy flow is followed by spotting before restarting again. She is sexually active without contraception and would like to start contraception. She has a diagnosis of PCOS and states that this irregular bleeding pattern has happened to her previously. She admits to easily gaining weight despite changing her diet and is interested in losing weight.  Past Medical History:  Diagnosis Date   Depression    Diabetes mellitus without complication (HCC)    Dysmenorrhea 09/17/2014   Generalized anxiety disorder    Menorrhagia 04/21/2013   Menstrual extraction 05/26/2013   Obesity    PCO (polycystic ovaries) 06/10/2013   Unspecified symptom associated with female genital organs 06/10/2013   Past Surgical History:  Procedure Laterality Date   EXTERNAL EAR SURGERY     stuck a corn kernnel in her ear when she was 4    Family History  Problem Relation Age of Onset   Diabetes Paternal Aunt    Diabetes Paternal Uncle    Diabetes Maternal Grandmother    Diabetes Maternal Grandfather    Other Mother        cysts on ovaries   Heart disease Father    Social History   Tobacco Use   Smoking status: Never   Smokeless tobacco: Never  Vaping Use   Vaping status: Never Used  Substance Use Topics   Alcohol use: No   Drug use: Not Currently   ROS  See pertinent in HPI. All other systems reviewed and non contributory Blood pressure 110/64, pulse 85, weight (!) 425 lb (192.8 kg), last menstrual period 10/07/2022, not currently breastfeeding.  GENERAL: Well-developed, well-nourished female in no acute distress.  NEURO: alert and oriented x 3  US PELVIC COMPLETE W TRANSVAGINAL AND TORSION R/O  Result Date:  10/18/2022 CLINICAL DATA:  Pelvic pain EXAM: TRANSABDOMINAL AND TRANSVAGINAL ULTRASOUND OF PELVIS DOPPLER ULTRASOUND OF OVARIES TECHNIQUE: Both transabdominal and transvaginal ultrasound examinations of the pelvis were performed. Transabdominal technique was performed for global imaging of the pelvis including uterus, ovaries, adnexal regions, and pelvic cul-de-sac. It was necessary to proceed with endovaginal exam following the transabdominal exam to visualize the uterus endometrium adnexa. Color and duplex Doppler ultrasound was utilized to evaluate blood flow to the ovaries. COMPARISON:  None Available. FINDINGS: Uterus Measurements: 9.4 x 6.4 x 7.4 cm = volume: 231.7 mL. No fibroids or other mass visualized. Endometrium Thickness: 10.4 mm.  Small amount of heterogenous fluid within. Right ovary Measurements: 2.9 x 2.2 x 2.9 cm = volume: 9.6 mL. Normal appearance/no adnexal mass. Left ovary Measurements: 3.7 x 2.1 x 3.2 cm = volume: 12.7 mL. Cyst measuring 2.9 x 1.7 x 2.1 cm, no imaging follow-up is recommended. Pulsed Doppler evaluation of both ovaries demonstrates normal low-resistance arterial and venous waveforms. Other findings Trace free fluid. IMPRESSION: 1. Small amount of heterogenous fluid within the endometrial canal, possible blood products. 2. 2.9 cm left ovarian cyst. No imaging follow-up is recommended. No evidence for ovarian torsion. Electronically Signed   By: Jasmine Pang M.D.   On: 10/18/2022 23:35     A/P 27 yo P2 with AUB - Reviewed normal ultrasound with patient - AUB likely related to PCOS and morbid obesity - patient  willing to restart COC. COC taper provided followed by 12 month refills of normal COC pack - Patient encouraged to continue her weight loss efforts and is being referred to nutritionist - Patient current on pap smear and declined STI screening - RTC prn

## 2022-11-10 ENCOUNTER — Encounter: Payer: Self-pay | Admitting: Obstetrics & Gynecology

## 2022-11-11 ENCOUNTER — Other Ambulatory Visit (HOSPITAL_COMMUNITY): Payer: Self-pay

## 2022-11-11 ENCOUNTER — Encounter (HOSPITAL_BASED_OUTPATIENT_CLINIC_OR_DEPARTMENT_OTHER): Payer: Self-pay

## 2022-11-11 ENCOUNTER — Other Ambulatory Visit (HOSPITAL_BASED_OUTPATIENT_CLINIC_OR_DEPARTMENT_OTHER): Payer: Self-pay

## 2022-11-13 ENCOUNTER — Other Ambulatory Visit: Payer: Self-pay

## 2022-11-13 ENCOUNTER — Other Ambulatory Visit (HOSPITAL_BASED_OUTPATIENT_CLINIC_OR_DEPARTMENT_OTHER): Payer: Self-pay

## 2022-11-14 ENCOUNTER — Other Ambulatory Visit: Payer: Self-pay | Admitting: Obstetrics & Gynecology

## 2022-11-14 DIAGNOSIS — N939 Abnormal uterine and vaginal bleeding, unspecified: Secondary | ICD-10-CM

## 2022-11-14 MED ORDER — NORETHINDRONE ACET-ETHINYL EST 1.5-30 MG-MCG PO TABS: 1.0000 | ORAL_TABLET | Freq: Every day | ORAL | 11 refills | Status: AC

## 2023-02-08 NOTE — Progress Notes (Addendum)
 CHIEF COMPLAINT:   Chief Complaint  Patient presents with  . N/V/D    Congestion, cough. Persistent symptoms since 02/01/2023. Sent by [facility] for retesting and provider evaluation.     SUBJECTIVE/HPI:  28 y.o.Female presents with persistent diarrhea, as well as URI symptoms.  Patient reports diarrhea for the past 10 or 11 days.  Has had about 6 episodes per day.  Typically feels abdominal cramping, then experiences an episode of diarrhea.  Has had intermittent vomiting as well.  Thinks there may have been some episodes of dark stools, however no BRB. Has also had nasal congestion, mild dry cough x almost a week. She works in the hospital. Her grandfather has been hospitalized and has had similar sx. She has a young son who she thinks may also have URI sx. No international travel. No new meds. Has been drinking plenty of pedialyte/gatorade. Denies fever/chills, CP, SOB, abd pain, urinary frequency, dysuria.  Medications, Allergies and Problem List personally reviewed in Epic today.   Past Medical History:  has a past medical history of Anxiety, Concussion, Depression, Diabetes mellitus type 2, uncomplicated (CMS-HCC), Headache(784.0), Hypertension (11/15/2011), and Obesity (11/15/2011). Prior to encounter Medications:  Current Outpatient Medications on File Prior to Visit  Medication Sig Dispense Refill  . HAILEY  1.5-30 mg-mcg tablet TAKE 1 TABLET BY MOUTH ONCE ADAY    . metFORMIN (GLUCOPHAGE) 1000 MG tablet Take 500 mg by mouth 2 (two) times daily. (Patient not taking: Reported on 02/08/2023)     No current facility-administered medications on file prior to visit.   Allergies: is allergic to amoxicillin. Social:  Social History   Socioeconomic History  . Marital status: Single  Tobacco Use  . Smoking status: Never  . Smokeless tobacco: Never  Substance and Sexual Activity  . Alcohol use: No  . Drug use: No  . Sexual activity: Yes    Partners: Male    Birth control/protection: Pill    Social Drivers of Health   Financial Resource Strain: Not on File (11/30/2020)   Received from [employer]   . Financial Resource Strain: 0  Food Insecurity: Not on File (11/02/2022)   Received from [employer]   . Food: 0  Transportation Needs: Not on File (11/30/2020)   Received from [employer]   . Transportation: 0   ROS:  Review of Systems As noted in HPI  OBJECTIVE:   Vitals:   02/08/23 1752  BP: (!) 149/89  Pulse: 94  Resp: 20  Temp: 36.7 C (98.1 F)  TempSrc: Oral  SpO2: 100%  Weight: (!) 191 kg (421 lb 1.3 oz)   General appearance: Alert, cooperative and no distress Head: Normocephalic, without obvious abnormality, atraumatic. Eyes: Conjunctivae/corneas clear.  Ears: Normal TM's and EACs bilaterally  Nose: No discharge. No congestion. No sinus TTP. Throat: Airway patent, moist mucous membranes. Uvula midline. No trismus. No oropharyngeal erythema. No tonsillar edema. No exudates. No PTA. Neck: No lymphadenopathy. Full ROM. Lungs: Normal work and rate of breathing. Clear to auscultation bilaterally with no wheezes, rales, rhonchi. Heart: RRR Skin: Warm and dry. Normal color with no appreciable rash. LABS/X-RAYS/EKG/MEDS:  No results found for this visit on 02/08/23.   ASSESSMENT/PLAN:  28 y.o.Female presents with persistent diarrhea, as well as URI symptoms. VSS. PE reassuring. Most likely one or multiple viral illnesses. D/t duration of diarrhea and hospital exposure, will send stool culture. Considered metform AE as this is listed on med list, however she  states she is not taking. Continue supportive care including hydration. Follow up/ED precautions given.    ICD-10-CM   1. Diarrhea, unspecified type  R19.7 Culture, Stool    Fecal Leukocytes    C. difficile PCR w/ reflex to Toxin EIA    Parasite Screen, Giardia and Cryptosporidium    2. Viral URI  J06.9 Respiratory Virus, Basic Panel, PCR      Requested Prescriptions    No prescriptions requested or ordered in this encounter    This note was partially made with the aid of speech-to-text dictation; typographical errors are not intentional.

## 2023-08-24 ENCOUNTER — Other Ambulatory Visit: Payer: Self-pay

## 2023-08-24 ENCOUNTER — Emergency Department
Admission: EM | Admit: 2023-08-24 | Discharge: 2023-08-24 | Disposition: A | Discharge status: Home / Self Care | Payer: Self-pay | Attending: Emergency Medicine | Admitting: Emergency Medicine

## 2023-08-24 ENCOUNTER — Emergency Department: Payer: Self-pay

## 2023-08-24 ENCOUNTER — Encounter: Payer: Self-pay | Admitting: Emergency Medicine

## 2023-08-24 DIAGNOSIS — Z3A00 Weeks of gestation of pregnancy not specified: Secondary | ICD-10-CM | POA: Insufficient documentation

## 2023-08-24 DIAGNOSIS — O039 Complete or unspecified spontaneous abortion without complication: Secondary | ICD-10-CM | POA: Insufficient documentation

## 2023-08-24 DIAGNOSIS — E119 Type 2 diabetes mellitus without complications: Secondary | ICD-10-CM | POA: Insufficient documentation

## 2023-08-24 LAB — POC URINE PREG, ED: Preg Test, Ur: POSITIVE — AB

## 2023-08-24 LAB — BASIC METABOLIC PANEL WITH GFR
Anion gap: 10 (ref 5–15)
BUN: 5 mg/dL — ABNORMAL LOW (ref 6–20)
CO2: 21 mmol/L — ABNORMAL LOW (ref 22–32)
Calcium: 9 mg/dL (ref 8.9–10.3)
Chloride: 107 mmol/L (ref 98–111)
Creatinine, Ser: 0.73 mg/dL (ref 0.44–1.00)
GFR, Estimated: >60 mL/min (ref >60)
Glucose, Bld: 121 mg/dL — ABNORMAL HIGH (ref 70–99)
Potassium: 3.7 mmol/L (ref 3.5–5.1)
Sodium: 138 mmol/L (ref 135–145)

## 2023-08-24 LAB — CBC
HCT: 32.9 % — ABNORMAL LOW (ref 36.0–46.0)
Hemoglobin: 11 g/dL — ABNORMAL LOW (ref 12.0–15.0)
MCH: 26.8 pg (ref 26.0–34.0)
MCHC: 33.4 g/dL (ref 30.0–36.0)
MCV: 80 fL (ref 80.0–100.0)
Platelets: 143 K/uL — ABNORMAL LOW (ref 150–400)
RBC: 4.11 MIL/uL (ref 3.87–5.11)
RDW: 20.5 % — ABNORMAL HIGH (ref 11.5–15.5)
WBC: 7.9 K/uL (ref 4.0–10.5)
nRBC: 0 % (ref 0.0–0.2)

## 2023-08-24 LAB — ABO/RH: ABO/RH(D): AB POS

## 2023-08-24 LAB — HCG, QUANTITATIVE, PREGNANCY: hCG, Beta Chain, Quant, S: 5848 m[IU]/mL — ABNORMAL HIGH (ref <5)

## 2023-08-24 NOTE — ED Triage Notes (Signed)
 Patient to ED via [facility] from home for vaginal bleeding and lower abd pain. Symptoms started today. Last period was 3 months ago. Has not taken a pregnancy test. HX of PCOS

## 2023-08-24 NOTE — Discharge Instructions (Signed)
 You have had a miscarriage according to your blood work and ultrasound.  However in the situations we like to see that your beta-hCG is decreasing.  You will need to have a repeat beta-hCG in 2 to 3 days.  If it is increasing we would have concerns of an ectopic pregnancy.  Please see your regular doctor for repeat beta-hCG, if you cannot see your regular doctor can go to urgent care or return emergency department

## 2023-08-24 NOTE — ED Provider Notes (Signed)
 [facility] Provider Note    Event Date/Time   First MD Initiated Contact with Patient 08/24/23 1021     (approximate)   History   Vaginal Bleeding   HPI  Susan Kaiser is a 28 y.o. female with diabetes, PCOS, obesity presents emergency department with vaginal bleeding and abdominal pain.  Patient states she has not had a menstrual cycle in 3 months.  Has PCOS so has irregular periods.  States she was on the way to work when she started bleeding.  Has pictures of the blood clots in her underwear.  Does not know whether she is pregnant or not.      Physical Exam   Triage Vital Signs: ED Triage Vitals [08/24/23 0959]  Encounter Vitals Group     BP (!) 134/95     Girls Systolic BP Percentile      Girls Diastolic BP Percentile      Boys Systolic BP Percentile      Boys Diastolic BP Percentile      Pulse Rate (!) 105     Resp 17     Temp 98.6 F (37 C)     Temp Source Oral     SpO2 100 %     Weight (!) 428 lb (194.1 kg)     Height 5' 10 (1.778 m)     Head Circumference      Peak Flow      Pain Score 8     Pain Loc      Pain Education      Exclude from Growth Chart     Most recent vital signs: Vitals:   08/24/23 0959 08/24/23 1202  BP: (!) 134/95 132/88  Pulse: (!) 105 98  Resp: 17 17  Temp: 98.6 F (37 C) 98.3 F (36.8 C)  SpO2: 100% 100%     General: Awake, no distress.   CV:  Good peripheral perfusion. regular rate and  rhythm Resp:  Normal effort. Lungs cta Abd:  No distention.  Slightly tender at the lower abdomen Other:      ED Results / Procedures / Treatments   Labs (all labs ordered are listed, but only abnormal results are displayed) Labs Reviewed  CBC - Abnormal; Notable for the following components:      Result Value   Hemoglobin 11.0 (*)    HCT 32.9 (*)    RDW 20.5 (*)    Platelets 143 (*)    All other components within normal limits  BASIC METABOLIC PANEL WITH GFR - Abnormal; Notable for the following  components:   CO2 21 (*)    Glucose, Bld 121 (*)    BUN 5 (*)    All other components within normal limits  HCG, QUANTITATIVE, PREGNANCY - Abnormal; Notable for the following components:   hCG, Beta Chain, Quant, S 5,848 (*)    All other components within normal limits  POC URINE PREG, ED - Abnormal; Notable for the following components:   Preg Test, Ur Positive (*)    All other components within normal limits  ABO/RH     EKG     RADIOLOGY Ultrasound OB less than 14 weeks    PROCEDURES:   Procedures  Critical Care: None Chief Complaint  Patient presents with   Vaginal Bleeding      MEDICATIONS ORDERED IN ED: Medications - No data to display   IMPRESSION / MDM / ASSESSMENT AND PLAN / ED COURSE  I reviewed the triage  vital signs and the nursing notes.                              Differential diagnosis includes, but is not limited to, threatened miscarriage, miscarriage, ectopic pregnancy, vaginal bleeding pregnancy  Patient's presentation is most consistent with acute illness / injury with system symptoms.    Labs, imaging ordered  POC positive, CBC metabolic panel grossly reassuring, beta-hCG is elevated at 5848 ABO/Rh is AB+ so patient will not need RhoGAM  Ultrasound OB less than 14 weeks, did independently review and interpret radiologist read as being negative for IUP.  Differential on this would indicate complete miscarriage versus ectopic pregnancy.  I did explain this to the patient.  I feel more likely she had a miscarriage.  However did encourage her to have a repeat beta-hCG in 2 to 3 days.  Will need a repeat ultrasound in 1 week.  Follow-up with her GYN doctor for this.  However she cannot see her GYN doctor she can go to urgent care or return emergency department.  She is in agreement with this treatment plan.  Strict instructions to return if worsening.  Discharged stable condition.      FINAL CLINICAL IMPRESSION(S) / ED DIAGNOSES    Final diagnoses:  Miscarriage     Rx / DC Orders   ED Discharge Orders     None        Note:  This document was prepared using Dragon voice recognition software and may include unintentional dictation errors.    Gasper Devere ORN, PA-C 08/24/23 1503    Dorothyann Drivers, MD 08/24/23 [phone]

## 2023-08-28 ENCOUNTER — Encounter: Payer: Self-pay | Admitting: Emergency Medicine

## 2023-08-28 ENCOUNTER — Other Ambulatory Visit: Payer: Self-pay

## 2023-08-28 ENCOUNTER — Emergency Department: Payer: Self-pay

## 2023-08-28 ENCOUNTER — Emergency Department
Admission: EM | Admit: 2023-08-28 | Discharge: 2023-08-28 | Disposition: A | Discharge status: Home / Self Care | Payer: Self-pay | Attending: Emergency Medicine | Admitting: Emergency Medicine

## 2023-08-28 DIAGNOSIS — Z3A00 Weeks of gestation of pregnancy not specified: Secondary | ICD-10-CM | POA: Insufficient documentation

## 2023-08-28 DIAGNOSIS — O039 Complete or unspecified spontaneous abortion without complication: Secondary | ICD-10-CM | POA: Insufficient documentation

## 2023-08-28 DIAGNOSIS — E119 Type 2 diabetes mellitus without complications: Secondary | ICD-10-CM | POA: Insufficient documentation

## 2023-08-28 LAB — LIPASE, BLOOD: Lipase: 43 U/L (ref 11–51)

## 2023-08-28 LAB — CBC
HCT: 33.5 % — ABNORMAL LOW (ref 36.0–46.0)
Hemoglobin: 11 g/dL — ABNORMAL LOW (ref 12.0–15.0)
MCH: 27.3 pg (ref 26.0–34.0)
MCHC: 32.8 g/dL (ref 30.0–36.0)
MCV: 83.1 fL (ref 80.0–100.0)
Platelets: 154 K/uL (ref 150–400)
RBC: 4.03 MIL/uL (ref 3.87–5.11)
RDW: 20.3 % — ABNORMAL HIGH (ref 11.5–15.5)
WBC: 7.8 K/uL (ref 4.0–10.5)
nRBC: 0 % (ref 0.0–0.2)

## 2023-08-28 LAB — COMPREHENSIVE METABOLIC PANEL WITH GFR
ALT: 63 U/L — ABNORMAL HIGH (ref 0–44)
AST: 113 U/L — ABNORMAL HIGH (ref 15–41)
Albumin: 3 g/dL — ABNORMAL LOW (ref 3.5–5.0)
Alkaline Phosphatase: 38 U/L (ref 38–126)
Anion gap: 12 (ref 5–15)
BUN: 6 mg/dL (ref 6–20)
CO2: 20 mmol/L — ABNORMAL LOW (ref 22–32)
Calcium: 8.5 mg/dL — ABNORMAL LOW (ref 8.9–10.3)
Chloride: 107 mmol/L (ref 98–111)
Creatinine, Ser: 0.81 mg/dL (ref 0.44–1.00)
GFR, Estimated: >60 mL/min (ref >60)
Glucose, Bld: 100 mg/dL — ABNORMAL HIGH (ref 70–99)
Potassium: 3.8 mmol/L (ref 3.5–5.1)
Sodium: 139 mmol/L (ref 135–145)
Total Bilirubin: 0.4 mg/dL (ref 0.0–1.2)
Total Protein: 6.7 g/dL (ref 6.5–8.1)

## 2023-08-28 LAB — URINALYSIS, ROUTINE W REFLEX MICROSCOPIC
Bilirubin Urine: NEGATIVE
Glucose, UA: NEGATIVE mg/dL
Ketones, ur: NEGATIVE mg/dL
Nitrite: NEGATIVE
Protein, ur: 100 mg/dL — AB
Specific Gravity, Urine: 1.019 (ref 1.005–1.030)
pH: 6 (ref 5.0–8.0)

## 2023-08-28 LAB — POC URINE PREG, ED: Preg Test, Ur: POSITIVE — AB

## 2023-08-28 LAB — HCG, QUANTITATIVE, PREGNANCY: hCG, Beta Chain, Quant, S: 56 m[IU]/mL — ABNORMAL HIGH (ref <5)

## 2023-08-28 MED ORDER — CEPHALEXIN 500 MG PO CAPS
500.0000 mg | ORAL_CAPSULE | Freq: Two times a day (BID) | ORAL | 0 refills | Status: AC
Start: 2023-08-28 — End: 2023-09-04

## 2023-08-28 MED ORDER — TRAMADOL HCL 50 MG PO TABS: 50.0000 mg | ORAL_TABLET | Freq: Four times a day (QID) | ORAL | 0 refills | Status: AC | PRN

## 2023-08-28 MED ORDER — TRAMADOL HCL 50 MG PO TABS
50.0000 mg | ORAL_TABLET | Freq: Once | ORAL | Status: AC
  Administered 2023-08-28: 50 mg via ORAL
  Filled 2023-08-28: qty 1

## 2023-08-28 NOTE — ED Provider Notes (Signed)
 [facility] Provider Note    Event Date/Time   First MD Initiated Contact with Patient 08/28/23 1714     (approximate)   History   Abdominal Pain   HPI  Susan Kaiser is a 28 y.o. female with history of diabetes, PCOS, and obesity who presents with abdominal pain.  The patient was seen in the ED 4 days ago with abdominal pain and vaginal bleeding.  She had a an hCG in the 5000's at that time but there was no pregnancy on the ultrasound.  Since that time she reports worsening abdominal pain that is migrated more to the lower abdomen.  It is bilateral but worse on the left.  She denies any associated nausea or vomiting.  She states that the bleeding has mostly subsided and is now just a small amount.  She denies feeling dizzy or lightheaded.  I reviewed the past medical records.  The patient was seen in the ED on 7/18 for abdominal pain and vaginal bleeding and was found to have an hCG and 5848 but no IUP seen on ultrasound.   Physical Exam   Triage Vital Signs: ED Triage Vitals  Encounter Vitals Group     BP 08/28/23 1530 (!) 151/105     Girls Systolic BP Percentile --      Girls Diastolic BP Percentile --      Boys Systolic BP Percentile --      Boys Diastolic BP Percentile --      Pulse Rate 08/28/23 1526 91     Resp 08/28/23 1526 16     Temp 08/28/23 1526 97.9 F (36.6 C)     Temp Source 08/28/23 1526 Oral     SpO2 08/28/23 1526 99 %     Weight 08/28/23 1529 (!) 427 lb 11.1 oz (194 kg)     Height 08/28/23 1529 5' 10 (1.778 m)     Head Circumference --      Peak Flow --      Pain Score 08/28/23 1529 8     Pain Loc --      Pain Education --      Exclude from Growth Chart --     Most recent vital signs: Vitals:   08/28/23 1530 08/28/23 1951  BP: (!) 151/105 (!) 151/98  Pulse:  89  Resp:  18  Temp:  97.9 F (36.6 C)  SpO2:  99%     General: Awake, no distress.  CV:  Good peripheral perfusion.  Resp:  Normal effort.  Abd:  Soft  with mild bilateral lower quadrant discomfort but no focal tenderness or peritoneal signs.  No distention.  Other:  No conjunctival pallor.  No jaundice or scleral icterus.   ED Results / Procedures / Treatments   Labs (all labs ordered are listed, but only abnormal results are displayed) Labs Reviewed  COMPREHENSIVE METABOLIC PANEL WITH GFR - Abnormal; Notable for the following components:      Result Value   CO2 20 (*)    Glucose, Bld 100 (*)    Calcium 8.5 (*)    Albumin 3.0 (*)    AST 113 (*)    ALT 63 (*)    All other components within normal limits  CBC - Abnormal; Notable for the following components:   Hemoglobin 11.0 (*)    HCT 33.5 (*)    RDW 20.3 (*)    All other components within normal limits  URINALYSIS, ROUTINE W REFLEX MICROSCOPIC - Abnormal;  Notable for the following components:   Color, Urine YELLOW (*)    APPearance HAZY (*)    Hgb urine dipstick LARGE (*)    Protein, ur 100 (*)    Leukocytes,Ua LARGE (*)    Bacteria, UA RARE (*)    All other components within normal limits  HCG, QUANTITATIVE, PREGNANCY - Abnormal; Notable for the following components:   hCG, Beta Chain, Quant, S 56 (*)    All other components within normal limits  POC URINE PREG, ED - Abnormal; Notable for the following components:   Preg Test, Ur POSITIVE (*)    All other components within normal limits  LIPASE, BLOOD     EKG     RADIOLOGY  US  OB: I independently viewed and interpreted the images; there is no visible IUP.  Radiology report indicates the following:  IMPRESSION:  No intrauterine pregnancy identified. No significant free fluid in  the pelvis. Although there is a differential with the above clinical  presentation, in principle ectopic is not excluded. Please correlate  with specific clinical presentation recommend close follow-up with  serial beta HCG and ultrasound.    Mildly complex small right-sided ovarian cystic lesion measuring 2.1  cm. Attention on  follow-up.     PROCEDURES:  Critical Care performed: No  Procedures   MEDICATIONS ORDERED IN ED: Medications  traMADol  (ULTRAM ) tablet 50 mg (50 mg Oral Given 08/28/23 1948)     IMPRESSION / MDM / ASSESSMENT AND PLAN / ED COURSE  I reviewed the triage vital signs and the nursing notes.  28 year old female with PMH as noted above and recently diagnosed with a pregnancy of unknown location presents with worsening abdominal pain which has migrated more to the lower abdomen.  On exam she is hypertensive with otherwise normal vital signs.  She overall appears well.  She has mild discomfort to the lower abdomen but no focal tenderness or peritoneal signs.  Differential diagnosis includes, but is not limited to, miscarriage, ectopic pregnancy, UTI, ovarian cyst rupture.  We will obtain a repeat hCG and ultrasound and reassess.  Patient's presentation is most consistent with acute complicated illness / injury requiring diagnostic workup.  ----------------------------------------- 7:45 PM on 08/28/2023 -----------------------------------------  CMP shows minimally elevated LFTs which are nonspecific.  CBC is unremarkable.  Urinalysis shows findings compatible with possible UTI with WBCs and RBCs as well as some bacteria.  hCG is 56.  Ultrasound continues to show no IUP although the ovaries are now better visualized than the scan from 4 days ago and there are no large or concerning cysts and no free fluid.  I consulted and discussed the case with Dr. Leonce from OB/GYN who reviewed the imaging and the clinical presentation.  He advises that given the significant drop in the hCG and the ultrasound findings, ectopic is very unlikely.  The patient has not required any pain medication while in the ED although did want something to go home since she already took Tylenol  before.  Dr. Leonce advises that given the patient's reassuring clinical presentation and the lab and imaging findings, she is  appropriate for discharge home with close outpatient follow-up.  I counseled the patient on the results of the workup and plan of care.  I recommend that she follow-up with her OB/GYN within the next few days.  Given the urinalysis findings I think she may have cystitis, which also would explain the lower abdominal pain.  I have prescribed a small quantity of tramadol  for pain as well  as Keflex  for UTI.  I gave the patient strict return precautions, and she expressed understanding and agreement.   FINAL CLINICAL IMPRESSION(S) / ED DIAGNOSES   Final diagnoses:  Miscarriage     Rx / DC Orders   ED Discharge Orders          Ordered    traMADol  (ULTRAM ) 50 MG tablet  Every 6 hours PRN        08/28/23 1944    cephALEXin  (KEFLEX ) 500 MG capsule  2 times daily        08/28/23 1945             Note:  This document was prepared using Dragon voice recognition software and may include unintentional dictation errors.    Jacolyn Pae, MD 08/28/23 2209

## 2023-08-28 NOTE — Discharge Instructions (Addendum)
 At this time there is no sign of a pregnancy in the uterus and it appears that you had a miscarriage.  You also may be having a bladder infection.  Take the Keflex  as prescribed, and you may take the tramadol  as needed for pain.  Follow-up with your OB/GYN in the next several days.  Return to the ER immediately for new, worsening, or persistent severe pain, weakness or lightheadedness, vomiting, increased vaginal bleeding or passage of clots or tissue, or any other new or worsening symptoms that concern you.

## 2023-08-28 NOTE — ED Triage Notes (Signed)
 Pt to ED via POV for lower abdominal pain. Pt states that pain is worse than when she was here a few days ago. Pt is in NAD.
